# Patient Record
Sex: Female | Born: 1965 | Race: White | Hispanic: No | Marital: Married | Smoking: Former smoker
Health system: Southern US, Community
[De-identification: ages and names within clinical notes are randomized; demographics above are authoritative.]

## PROBLEM LIST (undated history)

## (undated) DIAGNOSIS — M797 Fibromyalgia: Secondary | ICD-10-CM

## (undated) DIAGNOSIS — F319 Bipolar disorder, unspecified: Secondary | ICD-10-CM

## (undated) DIAGNOSIS — M1990 Unspecified osteoarthritis, unspecified site: Secondary | ICD-10-CM

## (undated) DIAGNOSIS — F419 Anxiety disorder, unspecified: Secondary | ICD-10-CM

## (undated) HISTORY — PX: ABDOMINAL HYSTERECTOMY: SHX81

---

## 2011-09-26 ENCOUNTER — Encounter: Payer: Self-pay | Admitting: *Deleted

## 2011-09-26 ENCOUNTER — Emergency Department (HOSPITAL_COMMUNITY)
Admission: EM | Admit: 2011-09-26 | Discharge: 2011-09-27 | Disposition: A | Discharge status: Other Acute Inpatient Hospital | Payer: Medicare Other | Source: Home / Self Care | Attending: Emergency Medicine | Admitting: Emergency Medicine

## 2011-09-26 DIAGNOSIS — F1410 Cocaine abuse, uncomplicated: Secondary | ICD-10-CM | POA: Insufficient documentation

## 2011-09-26 DIAGNOSIS — F319 Bipolar disorder, unspecified: Secondary | ICD-10-CM

## 2011-09-26 DIAGNOSIS — F3130 Bipolar disorder, current episode depressed, mild or moderate severity, unspecified: Secondary | ICD-10-CM | POA: Insufficient documentation

## 2011-09-26 DIAGNOSIS — IMO0001 Reserved for inherently not codable concepts without codable children: Secondary | ICD-10-CM | POA: Insufficient documentation

## 2011-09-26 DIAGNOSIS — F1210 Cannabis abuse, uncomplicated: Secondary | ICD-10-CM

## 2011-09-26 DIAGNOSIS — Z79899 Other long term (current) drug therapy: Secondary | ICD-10-CM | POA: Insufficient documentation

## 2011-09-26 DIAGNOSIS — R45851 Suicidal ideations: Secondary | ICD-10-CM | POA: Insufficient documentation

## 2011-09-26 DIAGNOSIS — R450 Nervousness: Secondary | ICD-10-CM | POA: Insufficient documentation

## 2011-09-26 DIAGNOSIS — F411 Generalized anxiety disorder: Secondary | ICD-10-CM | POA: Insufficient documentation

## 2011-09-26 DIAGNOSIS — R443 Hallucinations, unspecified: Secondary | ICD-10-CM | POA: Insufficient documentation

## 2011-09-26 DIAGNOSIS — IMO0002 Reserved for concepts with insufficient information to code with codable children: Secondary | ICD-10-CM | POA: Insufficient documentation

## 2011-09-26 DIAGNOSIS — M542 Cervicalgia: Secondary | ICD-10-CM | POA: Insufficient documentation

## 2011-09-26 DIAGNOSIS — R259 Unspecified abnormal involuntary movements: Secondary | ICD-10-CM | POA: Insufficient documentation

## 2011-09-26 HISTORY — DX: Bipolar disorder, unspecified: F31.9

## 2011-09-26 LAB — SALICYLATE LEVEL: Salicylate Lvl: <2 mg/dL — ABNORMAL LOW (ref 2.8–20.0)

## 2011-09-26 LAB — COMPREHENSIVE METABOLIC PANEL
BUN: 16 mg/dL (ref 6–23)
CO2: 20 mEq/L (ref 19–32)
Calcium: 8.4 mg/dL (ref 8.4–10.5)
Chloride: 108 mEq/L (ref 96–112)
Creatinine, Ser: 0.7 mg/dL (ref 0.50–1.10)
GFR calc non Af Amer: >90 mL/min (ref >90)
Total Bilirubin: 0.2 mg/dL — ABNORMAL LOW (ref 0.3–1.2)

## 2011-09-26 LAB — CBC
HCT: 33.6 % — ABNORMAL LOW (ref 36.0–46.0)
MCH: 30.2 pg (ref 26.0–34.0)
MCV: 88.2 fL (ref 78.0–100.0)
Platelets: 192 10*3/uL (ref 150–400)
RBC: 3.81 MIL/uL — ABNORMAL LOW (ref 3.87–5.11)
RDW: 13.5 % (ref 11.5–15.5)
WBC: 7.9 10*3/uL (ref 4.0–10.5)

## 2011-09-26 LAB — ETHANOL: Alcohol, Ethyl (B): <11 mg/dL (ref 0–11)

## 2011-09-26 MED ORDER — ZOLPIDEM TARTRATE 5 MG PO TABS
5.0000 mg | ORAL_TABLET | Freq: Every evening | ORAL | Status: DC | PRN
  Administered 2011-09-27: 5 mg via ORAL
  Filled 2011-09-26: qty 1

## 2011-09-26 MED ORDER — TRAMADOL HCL 50 MG PO TABS
100.0000 mg | ORAL_TABLET | Freq: Two times a day (BID) | ORAL | Status: DC | PRN
  Administered 2011-09-26: 100 mg via ORAL
  Filled 2011-09-26: qty 2

## 2011-09-26 MED ORDER — BUPROPION HCL ER (SR) 100 MG PO TB12
200.0000 mg | ORAL_TABLET | Freq: Two times a day (BID) | ORAL | Status: DC
  Administered 2011-09-27: 200 mg via ORAL
  Filled 2011-09-26 (×2): qty 2

## 2011-09-26 MED ORDER — CYCLOBENZAPRINE HCL 10 MG PO TABS
10.0000 mg | ORAL_TABLET | Freq: Once | ORAL | Status: AC
  Administered 2011-09-26: 10 mg via ORAL
  Filled 2011-09-26: qty 1

## 2011-09-26 MED ORDER — ALUM & MAG HYDROXIDE-SIMETH 200-200-20 MG/5ML PO SUSP: 30.0000 mL | ORAL | Status: DC | PRN

## 2011-09-26 MED ORDER — IBUPROFEN 200 MG PO TABS: 600.0000 mg | ORAL_TABLET | Freq: Three times a day (TID) | ORAL | Status: DC | PRN

## 2011-09-26 MED ORDER — POTASSIUM CHLORIDE 20 MEQ/15ML (10%) PO LIQD
20.0000 meq | Freq: Once | ORAL | Status: AC
  Administered 2011-09-27: 20 meq via ORAL
  Filled 2011-09-26: qty 15

## 2011-09-26 MED ORDER — LORAZEPAM 1 MG PO TABS
1.0000 mg | ORAL_TABLET | Freq: Three times a day (TID) | ORAL | Status: DC | PRN
  Administered 2011-09-26 – 2011-09-27 (×2): 1 mg via ORAL
  Filled 2011-09-26 (×2): qty 1

## 2011-09-26 MED ORDER — ONDANSETRON HCL 8 MG PO TABS: 4.0000 mg | ORAL_TABLET | Freq: Three times a day (TID) | ORAL | Status: DC | PRN

## 2011-09-26 MED ORDER — LORAZEPAM 2 MG/ML IJ SOLN
INTRAMUSCULAR | Status: AC
  Filled 2011-09-26: qty 1

## 2011-09-26 MED ORDER — AMOXICILLIN 500 MG PO CAPS
1000.0000 mg | ORAL_CAPSULE | Freq: Two times a day (BID) | ORAL | Status: DC
  Administered 2011-09-26: 1000 mg via ORAL
  Filled 2011-09-26: qty 2

## 2011-09-26 MED ORDER — ACETAMINOPHEN 325 MG PO TABS
650.0000 mg | ORAL_TABLET | ORAL | Status: DC | PRN
  Administered 2011-09-27 (×2): 650 mg via ORAL
  Filled 2011-09-26 (×2): qty 2

## 2011-09-26 NOTE — ED Notes (Signed)
To ed for eval of 'manic' episode. Pt and pt's husband state that pt held gun to head yesterday in front of son and friends. Pt denies wanting to kill herself.

## 2011-09-26 NOTE — ED Notes (Signed)
Family contact is greg Cinque [phone] cell Home [phone]

## 2011-09-26 NOTE — ED Provider Notes (Signed)
History     CSN: [id number] Arrival date & time: 09/26/2011  3:55 PM   First MD Initiated Contact with Patient 09/26/11 1625      Chief Complaint  Patient presents with  . Suicidal    (Consider location/radiation/quality/duration/timing/severity/associated sxs/prior treatment) HPI Pt reports feeling depressed "for a while', noting that she has had marital problems recently and has a son in [place], in addition to multiple other social stressors. Saturday evening, after consuming a significant amount of  EtOH, pt began arguing with husband. Pt "broke down" and put a loaded gun to her head. Husband reports pt was found by her son with the gun to her head. Pt disagrees, saying she did not intend to harm herself and was trying to make a point; also reports she did not know the gun was loaded. Pt denies any SI or suicidal plan. Pt denies HI. Pt denies hallucinations, but husband reports she was speaking to people that were not present at the party. Pt admits to prior suicide attempt, in 2005. Pt has had multiple psychiatric admissions for bipolar, most recently in 2005. Pt states that she would like to be admitted for psychiatric treatment today. Pt reports pain to entire body from ultram wearing off and feels her fibromyalgia is "flaring up".    Past Medical History  Diagnosis Date  . Bipolar 1 disorder     Past Surgical History  Procedure Date  . Abdominal hysterectomy     History reviewed. No pertinent family history.  History  Substance Use Topics  . Smoking status: Not on file  . Smokeless tobacco: Not on file  . Alcohol Use: No     Review of Systems  Constitutional: Negative for fever and chills.  HENT: Positive for neck pain. Negative for hearing loss, ear pain, nosebleeds, neck stiffness, voice change, sinus pressure and tinnitus.   Eyes: Negative for pain and visual disturbance.  Respiratory: Negative for cough, chest tightness and shortness of breath.     Cardiovascular: Negative for chest pain and palpitations.  Gastrointestinal: Negative for nausea, vomiting, abdominal pain and diarrhea.  Genitourinary: Negative for dysuria and hematuria.  Musculoskeletal: Negative for back pain, joint swelling and gait problem.  Skin: Negative for rash and wound.  Neurological: Negative for dizziness, seizures, syncope, weakness, numbness and headaches.  Psychiatric/Behavioral: Positive for hallucinations, behavioral problems, decreased concentration and agitation. Negative for confusion and sleep disturbance. The patient is nervous/anxious.     Allergies  Review of patient's allergies indicates no known allergies.  Home Medications   Current Outpatient Rx  Name Route Sig Dispense Refill  . AMOXICILLIN 500 MG PO CAPS Oral Take 1,000 mg by mouth 2 (two) times daily. On course for sinus infection     . AMPHETAMINE-DEXTROAMPHETAMINE 10 MG PO TABS Oral Take 10 mg by mouth 2 (two) times daily as needed. ADD     . BUPROPION HCL ER (SR) 200 MG PO TB12 Oral Take 200 mg by mouth 2 (two) times daily.      Marland Kitchen CLONAZEPAM 1 MG PO TABS Oral Take 1-2 mg by mouth 3 (three) times daily. Takes 1 tab twice a day and 2 tabs at bedtime.     . CYCLOBENZAPRINE HCL 10 MG PO TABS Oral Take 20 mg by mouth at bedtime. Muscle spasm.     Marland Kitchen ESOMEPRAZOLE MAGNESIUM 40 MG PO CPDR Oral Take 40 mg by mouth daily before breakfast.      . MELOXICAM 15 MG PO TABS Oral Take 15 mg  by mouth daily.      Marland Kitchen OMEPRAZOLE 20 MG PO CPDR Oral Take 20 mg by mouth daily.      . TOPIRAMATE 200 MG PO TABS Oral Take 6,000 mg by mouth 2 (two) times daily.      . TRAMADOL HCL 50 MG PO TABS Oral Take 50 mg by mouth every 6 (six) hours as needed. Maximum dose= 8 tablets per day. pain       BP 119/75  Pulse 91  Temp(Src) 98 F (36.7 C) (Oral)  Resp 16  SpO2 98%  Physical Exam  Nursing note and vitals reviewed. Constitutional: She is oriented to person, place, and time. She appears well-developed and  well-nourished.       Anxious appearing  HENT:  Head: Normocephalic and atraumatic.  Eyes: EOM are normal. Pupils are equal, round, and reactive to light.  Neck: Normal range of motion. Neck supple.  Cardiovascular: Normal rate, regular rhythm, normal heart sounds and intact distal pulses.   Pulmonary/Chest: Effort normal. No respiratory distress. She has no wheezes.  Abdominal: Soft. Bowel sounds are normal. She exhibits no distension. There is no tenderness.  Musculoskeletal: Normal range of motion. She exhibits no edema and no tenderness.  Lymphadenopathy:    She has no cervical adenopathy.  Neurological: She is alert and oriented to person, place, and time. No cranial nerve deficit. Coordination normal.       Slightly tremulous  Skin: Skin is warm and dry. No rash noted.    ED Course  Procedures (including critical care time)  Labs Reviewed  CBC - Abnormal; Notable for the following:    RBC 3.81 (*)    Hemoglobin 11.5 (*)    HCT 33.6 (*)    All other components within normal limits  COMPREHENSIVE METABOLIC PANEL - Abnormal; Notable for the following:    Potassium 3.3 (*)    Glucose, Bld 126 (*)    Total Bilirubin 0.2 (*)    All other components within normal limits  URINE RAPID DRUG SCREEN (HOSP PERFORMED) - Abnormal; Notable for the following:    Cocaine POSITIVE (*)    Benzodiazepines POSITIVE (*)    Tetrahydrocannabinol POSITIVE (*)    All other components within normal limits  SALICYLATE LEVEL - Abnormal; Notable for the following:    Salicylate Lvl <2.0 (*)    All other components within normal limits  ETHANOL  ACETAMINOPHEN LEVEL   No results found.   1. Bipolar disorder   2. Cocaine abuse   3. Marijuana abuse       MDM  Patient presents for medical clearance. She denies SI, HI, or hallucinations; or is provided a different story and I do have concerns that she is a danger to herself. She is here voluntarily though if she changes her mind and would like  to leave, we may need to place her on involuntary status. I will call the ACT team   5:40 PM I have spoken with the counselor with the ACT team who will come to see the patient.       [address] [place], [place] 09/27/11 [phone]

## 2011-09-27 ENCOUNTER — Encounter (HOSPITAL_COMMUNITY): Payer: Self-pay | Admitting: *Deleted

## 2011-09-27 ENCOUNTER — Inpatient Hospital Stay (HOSPITAL_COMMUNITY)
Admission: AD | Admit: 2011-09-27 | Discharge: 2011-09-30 | DRG: 885 | Disposition: A | Discharge status: Home / Self Care | Payer: Medicare Other | Source: Ambulatory Visit | Attending: Psychiatry | Admitting: Psychiatry

## 2011-09-27 DIAGNOSIS — F1210 Cannabis abuse, uncomplicated: Secondary | ICD-10-CM

## 2011-09-27 DIAGNOSIS — Z79899 Other long term (current) drug therapy: Secondary | ICD-10-CM

## 2011-09-27 DIAGNOSIS — F3160 Bipolar disorder, current episode mixed, unspecified: Principal | ICD-10-CM

## 2011-09-27 DIAGNOSIS — F609 Personality disorder, unspecified: Secondary | ICD-10-CM

## 2011-09-27 DIAGNOSIS — F411 Generalized anxiety disorder: Secondary | ICD-10-CM | POA: Diagnosis present

## 2011-09-27 DIAGNOSIS — F3162 Bipolar disorder, current episode mixed, moderate: Secondary | ICD-10-CM | POA: Diagnosis present

## 2011-09-27 DIAGNOSIS — M129 Arthropathy, unspecified: Secondary | ICD-10-CM

## 2011-09-27 DIAGNOSIS — F1910 Other psychoactive substance abuse, uncomplicated: Secondary | ICD-10-CM | POA: Diagnosis present

## 2011-09-27 DIAGNOSIS — IMO0001 Reserved for inherently not codable concepts without codable children: Secondary | ICD-10-CM

## 2011-09-27 DIAGNOSIS — F1010 Alcohol abuse, uncomplicated: Secondary | ICD-10-CM

## 2011-09-27 DIAGNOSIS — F312 Bipolar disorder, current episode manic severe with psychotic features: Secondary | ICD-10-CM

## 2011-09-27 DIAGNOSIS — F1410 Cocaine abuse, uncomplicated: Secondary | ICD-10-CM

## 2011-09-27 HISTORY — DX: Anxiety disorder, unspecified: F41.9

## 2011-09-27 HISTORY — DX: Fibromyalgia: M79.7

## 2011-09-27 HISTORY — DX: Unspecified osteoarthritis, unspecified site: M19.90

## 2011-09-27 LAB — RAPID URINE DRUG SCREEN, HOSP PERFORMED
Cocaine: POSITIVE — AB
Opiates: NOT DETECTED

## 2011-09-27 MED ORDER — ARIPIPRAZOLE 2 MG PO TABS
2.0000 mg | ORAL_TABLET | Freq: Every day | ORAL | Status: DC
  Administered 2011-09-27 – 2011-09-28 (×2): 2 mg via ORAL
  Filled 2011-09-27 (×3): qty 1

## 2011-09-27 MED ORDER — CLONAZEPAM 1 MG PO TABS
2.0000 mg | ORAL_TABLET | Freq: Every day | ORAL | Status: DC
  Administered 2011-09-27 – 2011-09-29 (×3): 2 mg via ORAL
  Filled 2011-09-27 (×3): qty 2

## 2011-09-27 MED ORDER — AMOXICILLIN 500 MG PO CAPS
1000.0000 mg | ORAL_CAPSULE | Freq: Two times a day (BID) | ORAL | Status: DC
  Administered 2011-09-27 – 2011-09-30 (×6): 1000 mg via ORAL
  Filled 2011-09-27: qty 20
  Filled 2011-09-27 (×4): qty 2
  Filled 2011-09-27: qty 20
  Filled 2011-09-27 (×6): qty 2

## 2011-09-27 MED ORDER — CYCLOBENZAPRINE HCL 10 MG PO TABS
20.0000 mg | ORAL_TABLET | Freq: Every day | ORAL | Status: DC
  Administered 2011-09-27 – 2011-09-29 (×3): 20 mg via ORAL
  Filled 2011-09-27 (×3): qty 2
  Filled 2011-09-27: qty 10
  Filled 2011-09-27 (×2): qty 2

## 2011-09-27 MED ORDER — BUPROPION HCL ER (SR) 100 MG PO TB12
200.0000 mg | ORAL_TABLET | Freq: Two times a day (BID) | ORAL | Status: DC
  Administered 2011-09-27 – 2011-09-30 (×6): 200 mg via ORAL
  Filled 2011-09-27 (×6): qty 2
  Filled 2011-09-27: qty 28
  Filled 2011-09-27 (×3): qty 2
  Filled 2011-09-27: qty 28
  Filled 2011-09-27: qty 2

## 2011-09-27 MED ORDER — MELOXICAM 15 MG PO TABS
15.0000 mg | ORAL_TABLET | Freq: Every day | ORAL | Status: DC
  Administered 2011-09-27 – 2011-09-28 (×2): 15 mg via ORAL
  Filled 2011-09-27 (×4): qty 1

## 2011-09-27 MED ORDER — POTASSIUM CHLORIDE CRYS ER 20 MEQ PO TBCR
20.0000 meq | EXTENDED_RELEASE_TABLET | Freq: Two times a day (BID) | ORAL | Status: AC
Start: 2011-09-27 — End: 2011-09-29
  Administered 2011-09-27 – 2011-09-29 (×4): 20 meq via ORAL
  Filled 2011-09-27 (×4): qty 1

## 2011-09-27 MED ORDER — CLONAZEPAM 0.5 MG PO TABS
0.5000 mg | ORAL_TABLET | ORAL | Status: DC
  Administered 2011-09-27 – 2011-09-28 (×3): 0.5 mg via ORAL
  Filled 2011-09-27 (×3): qty 1

## 2011-09-27 MED ORDER — TRAMADOL HCL 50 MG PO TABS
50.0000 mg | ORAL_TABLET | Freq: Four times a day (QID) | ORAL | Status: DC | PRN
  Administered 2011-09-27 – 2011-09-28 (×3): 50 mg via ORAL
  Filled 2011-09-27 (×3): qty 1

## 2011-09-27 MED ORDER — TOPIRAMATE 100 MG PO TABS
600.0000 mg | ORAL_TABLET | Freq: Every day | ORAL | Status: DC
  Administered 2011-09-27 – 2011-09-29 (×3): 600 mg via ORAL
  Filled 2011-09-27 (×5): qty 6
  Filled 2011-09-27: qty 30

## 2011-09-27 MED ORDER — PANTOPRAZOLE SODIUM 40 MG PO TBEC
40.0000 mg | DELAYED_RELEASE_TABLET | Freq: Every day | ORAL | Status: DC
  Administered 2011-09-27 – 2011-09-29 (×3): 40 mg via ORAL
  Filled 2011-09-27 (×3): qty 1
  Filled 2011-09-27: qty 5
  Filled 2011-09-27 (×2): qty 1

## 2011-09-27 NOTE — Progress Notes (Addendum)
Patient admitted to the unit this morning.  Was cooperative with the admission process.  Full admission not able to be completed until dinner time.  Her husband was there at that time and they got into a fight over her urine being positive for cocaine.  She continues to deny that she used any, but does admit to [facility].  She also admits to ETOH use, average of 4-6 drinks at least 3 days per week.  While her husband was here she wrote a note on a napkin that she handed to me indicating husband was using LSD.  She was oriented to the unit and offered food and fluids.  Addendum:  Patients husband left the unit a little while ago and she came to me stating she is the one who did the LSD on Sunday.  She still denies using cocaine, but she was encouraged to call her son and speak with him about what he recalls of the events while she was at his home.  She did continue to elude to far more substance abuse than she admitted to on the initial interview.  She will likely have to be watched for symptoms of DT's.

## 2011-09-27 NOTE — Progress Notes (Signed)
Per State Regulation 482.30  This chart was reviewed for medical necessity with respect to the patient's Admission/Duration of stay.   Next review due:  09/30/11   Ambrose Mantle, LCSW  09/27/2011  3:04 PM

## 2011-09-27 NOTE — BH Assessment (Signed)
Assessment Note   Rita Bennett is an 45 y.o. female.  Rita Bennett was brought to [facility] by her spouse, Tammy Sours.  On Saturday she and husband had been arguing off and on.  This has happened steadily over the last few weeks.  Rita Bennett said that she drank some tequila shots and beers that day.  Husband said that she came out of the bedroom and was arguing with unseen persons and rolling on the floor.  There were other people at the home visiting.  Rita Bennett admits that later she went to the bedroom and got the gun from under the bed and held it to her head and told others in the home "You wouldn't think it was funny if I killed myself."  Rita Bennett said that she remembers doing that but not the arguing with unseen persons.  This mania continued throughout Sunday also.  By husband's report she was hallucinating and throwing things at him and yelling.  She said that she does not remember doing these things.  At this time she denies any SI but has history of attempts and inpatient hospitalizations.  Rita Bennett denies HI at this time.  Rita Bennett denies current A/V hallucinations but husband said that she was behaving as if responding to internal stimuli most of the weekend.  Rita Bennett admits that she has been off of some of her medications.  Her psychiatrist is not seen until two more months.  Rita Bennett needs to be stabilized on her medications at a psychiatric hospital at this time.  She has been at [facility] before, with the most recent visit being in 2005. Axis I: Bipolar, Manic Axis II: Deferred Axis III:  Past Medical History  Diagnosis Date  . Bipolar 1 disorder    Axis IV: occupational problems and other psychosocial or environmental problems Axis V: 31-40 impairment in reality testing  Past Medical History:  Past Medical History  Diagnosis Date  . Bipolar 1 disorder     Past Surgical History  Procedure Date  . Abdominal hysterectomy     Family History: History reviewed. No pertinent family history.  Social History:  does  not have a smoking history on file. She does not have any smokeless tobacco history on file. She reports that she does not drink alcohol or use illicit drugs.  Allergies: No Known Allergies  Home Medications:  Medications Prior to Admission  Medication Dose Route Frequency Provider Last Rate Last Dose  . acetaminophen (TYLENOL) tablet 650 mg  650 mg Oral Q4H PRN Shaaron Adler, PA   650 mg at 09/27/11 0110  . alum & mag hydroxide-simeth (MAALOX/MYLANTA) 200-200-20 MG/5ML suspension 30 mL  30 mL Oral PRN Shaaron Adler, PA      . amoxicillin (AMOXIL) capsule 1,000 mg  1,000 mg Oral Q12H Shaaron Adler, [place]   1,000 mg at 09/26/11 2317  . buPROPion [facility] SR) 12 hr tablet 200 mg  200 mg Oral BID Shaaron Adler, PA   200 mg at 09/27/11 0114  . cyclobenzaprine (FLEXERIL) tablet 10 mg  10 mg Oral Once Shaaron Adler, [place]   10 mg at 09/26/11 2317  . ibuprofen (ADVIL,MOTRIN) tablet 600 mg  600 mg Oral Q8H PRN Shaaron Adler, [place]      . LORazepam (ATIVAN) tablet 1 mg  1 mg Oral Q8H PRN Shaaron Adler, PA   1 mg at 09/26/11 2008  . ondansetron (ZOFRAN) tablet 4 mg  4 mg Oral Q8H PRN Shaaron Adler, [place]      .  potassium chloride 20 MEQ/15ML (10%) liquid 20 mEq  20 mEq Oral Once Shaaron Adler, PA   20 mEq at 09/27/11 0110  . traMADol (ULTRAM) tablet 100 mg  100 mg Oral Q12H PRN Shaaron Adler, PA   100 mg at 09/26/11 2008  . zolpidem (AMBIEN) tablet 5 mg  5 mg Oral QHS PRN Shaaron Adler, PA   5 mg at 09/27/11 0110  . DISCONTD: LORazepam (ATIVAN) 2 MG/ML injection            No current outpatient prescriptions on file as of 09/26/2011.    OB/GYN Status:  No LMP recorded.  General Assessment Data Assessment Number: 1  Living Arrangements: Spouse/significant other Can pt return to current living arrangement?: Yes Admission Status: Voluntary Is patient capable of signing voluntary admission?:  Yes Transfer from: Acute Hospital Referral Source: Self/Family/Friend  Risk to self Suicidal Ideation: No Suicidal Intent:  (Did hold a gun to her head on Saturday 12/01) Is patient at risk for suicide?: Yes Suicidal Plan?:  (Did hold gun to head.  Access to weapons.) Access to Means:  (Guns have been put away.) What has been your use of drugs/alcohol within the last 12 months?:  (Has continued to abuse ETOH.) Other Self Harm Risks:  (Pointing a gun at her own head.) Triggers for Past Attempts: None known Intentional Self Injurious Behavior: None Factors that decrease suicide risk: Sense of responsibility to family Family Suicide History: Unknown Recent stressful life event(s): Conflict (Comment) (Increased arguements with spouse.) Persecutory voices/beliefs?: No Depression: Yes Depression Symptoms: Insomnia;Isolating;Loss of interest in usual pleasures;Feeling angry/irritable Substance abuse history and/or treatment for substance abuse?: Yes Suicide prevention information given to non-admitted patients: Not applicable  Risk to Others Homicidal Ideation: No Thoughts of Harm to Others: No Current Homicidal Intent: No Current Homicidal Plan: No Access to Homicidal Means: No Identified Victim:  (No one) History of harm to others?: No Assessment of Violence: None Noted Violent Behavior Description:  (Patient manic but cooperative.) Does patient have access to weapons?: Yes (Comment) Criminal Charges Pending?: No Does patient have a court date: No  Mental Status Report Appear/Hygiene:  (Casual) Eye Contact: Good Motor Activity: Agitation;Restlessness Speech: Rapid;Tangential Level of Consciousness: Alert Mood: Depressed;Anxious;Sad Affect: Anxious;Depressed Anxiety Level: Moderate Thought Processes: Coherent;Irrelevant;Tangential Judgement: Impaired Orientation: Person;Place;Time;Situation Obsessive Compulsive Thoughts/Behaviors: Minimal  Cognitive  Functioning Concentration: Decreased Memory: Remote Intact;Recent Impaired IQ: Average Insight: Fair Impulse Control: Poor Appetite: Good Weight Loss:  (N/A) Weight Gain:  (N/A) Sleep: No Change Total Hours of Sleep:  (<6H/D Disturbed sleep) Vegetative Symptoms: Staying in bed  Prior Inpatient/Outpatient Therapy Prior Therapy: Outpatient Prior Therapy Dates:  (Since 2004) Prior Therapy Facilty/Provider(s):  (Dr. Maudry Mayhew) Reason for Treatment:  (Bi-polar d/o)  ADL Screening (condition at time of admission) Patient's cognitive ability adequate to safely complete daily activities?: Yes Patient able to express need for assistance with ADLs?: Yes Independently performs ADLs?: Yes Weakness of Legs: None Weakness of Arms/Hands: None  Home Assistive Devices/Equipment Home Assistive Devices/Equipment: None      Values / Beliefs Cultural Requests During Hospitalization: None Spiritual Requests During Hospitalization: None        Additional Information 1:1 In Past 12 Months?: No CIRT Risk: No Elopement Risk: No Does patient have medical clearance?: Yes     Disposition:  Disposition Disposition of Patient: Inpatient treatment program Type of inpatient treatment program: Adult (Please run)  On Site Evaluation by:   Reviewed with Physician:  Dr. Karlene Einstein, Berna Spare  Ray 09/27/2011 1:53 AM

## 2011-09-27 NOTE — Progress Notes (Signed)
Suicide Risk Assessment  Admission Assessment     Demographic factors:    Current Mental Status:    Loss Factors:    Historical Factors:    Risk Reduction Factors:     CLINICAL FACTORS:   Severe Anxiety and/or Agitation Bipolar Disorder:   Mixed State Alcohol/Substance Abuse/Dependencies Chronic Pain More than one psychiatric diagnosis Previous Psychiatric Diagnoses and Treatments Medical Diagnoses and Treatments/Surgeries  Diagnosis:  Axis I: Bipolar I Disorder - Mixed Type. Polysubstance Abuse - Alcohol, Cannabis and Cocaine.  The patient was seen today and reports the following:   AL's: Intact  Sleep: The patient reports to having difficulty maintaining sleep with frequent awakenings. Appetite: The patient reports a fair appetite.   Mild>(1-10) >Severe  Hopelessness (1-10): 0  Depression (1-10): 6-7  Anxiety (1-10): 7-8   Suicidal Ideation: The patient adamantly denies any suicidal ideations today.  Plan: No  Intent: No  Means: No   Homicidal Ideation: The patient adamantly denies any homicidal ideations today.  Plan: No  Intent: No.  Means: No   General Appearance /Behavior: Neat and Casual Eye Contact: Good. Speech: Normal. Motor Behavior: Mildly Agitated. Level of Consciousness: Alert. Mental Status: O x 3 Mood: Moderately Depressed. Affect: Moderately Agitated. Anxiety Level: Moderate to severely anxious. Thought Process: Some mild disorganization. Thought Content: The patient denies any auditory or visual hallucinations or delusional thinking. Perception: Normal. Judgment: Fair. Insight: Fair. Cognition: Orientation time, place and person.   Treatment Plan Summary: 1. Daily contact with patient to assess and evaluate symptoms and progress in treatment  2. Medication management  3. The patient will deny suicidal ideations or homicidal ideations for 48 hours prior to discharge and have a depression and anxiety rating of 3 or less. The patient will  also deny any auditory or visual hallucinations or delusional thinking and display no manic or hypomanic behaviors.   Plan:  1. Restarted medications as prescribed by the patient's outpatient providers.  2. Will add Abilify 2 mgs po qhs x 1 day with plans to increase to 5 mgs po qhs tomorrow if tolerated for mood stabilization. 3. Will D/C/ Adderall. 4. Continue to monitor.   SUICIDE RISK:   Minimal: No identifiable suicidal ideation.  Patients presenting with no risk factors but with morbid ruminations; may be classified as minimal risk based on the severity of the depressive symptoms   Randy Readling 09/27/2011, 12:20 PM

## 2011-09-27 NOTE — H&P (Signed)
Psychiatric Admission Assessment Adult  Patient Identification:  Rita Bennett Date of Evaluation:  09/27/2011  History of Present Illness:: THis is a 45yo MWM who presented to the ED impaired from Cocaine THC Benzoes and alcohol. She had pointed a loaded gun to her head after arguing with her husband-to make a point. Was not aware that the  gun was loaded and denies she was suicidal.Reports she had more depressed recently and drank to be sociable with visitors in her home. She said she had too much too drink and blacked out. Denies DT's or withdrawal seizures.  Past Psychiatric History:one prior admission here 2005 similar situation. Has been treated by Dr. Evelene Croon since 2004.  Substance Abuse History:She drinks but does not consider it to be a problem. Husband says her drinking can be a problem and she is minimizing her substance abuse issues.  Social History:    reports that she quit smoking about 21 months ago. She does not have any smokeless tobacco history on file. She reports that she drinks alcohol. She reports that she uses illicit drugs (Cocaine and Marijuana). This is her 3rd marriage. This relationship is 46 years old and they have been married over 2 years. Has a GED and 2 sons ages 35 & 56. She receives disability.  Family Psych History:Says her father was Bipolar .  Past Medical History:     Past Medical History  Diagnosis Date  . Bipolar 1 disorder   . Fibromyalgia   . Arthritis   . Anxiety        Past Surgical History  Procedure Date  . Abdominal hysterectomy     Allergies: No Known Allergies  Current Medications:  Prior to Admission medications   Medication Sig Start Date End Date Taking? Authorizing Provider  amoxicillin (AMOXIL) 500 MG capsule Take 1,000 mg by mouth 2 (two) times daily. On course for sinus infection   Yes Historical Provider, MD  meloxicam (MOBIC) 15 MG tablet Take 15 mg by mouth daily.    Yes Historical Provider, MD  topiramate (TOPAMAX) 200 MG  tablet Take 600 mg by mouth 2 (two) times daily.    Yes Historical Provider, MD  traMADol (ULTRAM) 50 MG tablet Take 50 mg by mouth every 6 (six) hours as needed. Maximum dose= 8 tablets per day. pain   Yes Historical Provider, MD  buPROPion (WELLBUTRIN SR) 200 MG 12 hr tablet Take 200 mg by mouth 2 (two) times daily.     Historical Provider, MD  cyclobenzaprine (FLEXERIL) 10 MG tablet Take 20 mg by mouth at bedtime. Muscle spasm.    Historical Provider, MD    Mental Status Examination/Evaluation: Objective:  Appearance: Disheveled  Psychomotor Activity:  Decreased  Eye Contact::  Good  Speech:  Normal Rate  Volume:  Normal  Mood:  Feels agitated has mild hand tremor   Affect:  Congruent  Thought Process:    Orientation:  Full  Thought Content:  Clear rational goal oriented  Suicidal Thoughts:  No  Homicidal Thoughts:  No  Judgement:  Impaired  Insight:  Shallow    DIAGNOSIS:    AXIS I Substance Induced Mood Disorder  AXIS II Personality Disorder NOS  AXIS III See medical history.  AXIS IV Substance abuse and chronic pain  AXIS V 51-60 moderate symptoms     Treatment Plan Summary: Admit for medically supported substance withdrawal. Psych meds will be adjusted as indicated. Has outside prescriber and therapist.   Agree with H&P from ED she had no  additional symptoms or concerns tonight.

## 2011-09-27 NOTE — ED Provider Notes (Signed)
Medical screening examination/treatment/procedure(s) were performed by non-physician practitioner and as supervising physician I was immediately available for consultation/collaboration.   Leigh-Ann Jemell Town, MD 09/27/11 1521 

## 2011-09-27 NOTE — BH Assessment (Signed)
Assessment Note   Rita Bennett is an 45 y.o. female.  Update:  Writer was briefed on pt status.  Writer informed that pt was accepted to [facility] by NP Landry Corporal to Dr. Allena Katz to bed 402-2.  Previous clinician notified EDP Otter.  Writer completed support paperwork and assessment notification and faxed to [facility] to log.  Updated ED staff.  Pt is to be transported to [facility] via security, as pt is voluntary and ED staff to arrange transport.  Axis I: Bipolar, Manic Axis II: Deferred Axis III:  Past Medical History  Diagnosis Date  . Bipolar 1 disorder    Axis IV: occupational problems and other psychosocial or environmental problems Axis V: 31-40 impairment in reality testing  Past Medical History:  Past Medical History  Diagnosis Date  . Bipolar 1 disorder     Past Surgical History  Procedure Date  . Abdominal hysterectomy     Family History: History reviewed. No pertinent family history.  Social History:  does not have a smoking history on file. She does not have any smokeless tobacco history on file. She reports that she does not drink alcohol or use illicit drugs.  Allergies: No Known Allergies  Home Medications:  Medications Prior to Admission  Medication Dose Route Frequency Provider Last Rate Last Dose  . acetaminophen (TYLENOL) tablet 650 mg  650 mg Oral Q4H PRN Shaaron Adler, PA   650 mg at 09/27/11 1610  . alum & mag hydroxide-simeth (MAALOX/MYLANTA) 200-200-20 MG/5ML suspension 30 mL  30 mL Oral PRN Shaaron Adler, PA      . amoxicillin (AMOXIL) capsule 1,000 mg  1,000 mg Oral Q12H Shaaron Adler, [place]   1,000 mg at 09/26/11 2317  . buPROPion [facility] SR) 12 hr tablet 200 mg  200 mg Oral BID Shaaron Adler, PA   200 mg at 09/27/11 0114  . cyclobenzaprine (FLEXERIL) tablet 10 mg  10 mg Oral Once Shaaron Adler, [place]   10 mg at 09/26/11 2317  . ibuprofen (ADVIL,MOTRIN) tablet 600 mg  600 mg Oral Q8H PRN Shaaron Adler,  [place]      . LORazepam (ATIVAN) tablet 1 mg  1 mg Oral Q8H PRN Shaaron Adler, PA   1 mg at 09/27/11 9604  . ondansetron (ZOFRAN) tablet 4 mg  4 mg Oral Q8H PRN Shaaron Adler, PA      . potassium chloride 20 MEQ/15ML (10%) liquid 20 mEq  20 mEq Oral Once Shaaron Adler, PA   20 mEq at 09/27/11 0110  . traMADol (ULTRAM) tablet 100 mg  100 mg Oral Q12H PRN Shaaron Adler, PA   100 mg at 09/26/11 2008  . zolpidem (AMBIEN) tablet 5 mg  5 mg Oral QHS PRN Shaaron Adler, PA   5 mg at 09/27/11 0110  . DISCONTD: LORazepam (ATIVAN) 2 MG/ML injection            No current outpatient prescriptions on file as of 09/26/2011.    OB/GYN Status:  No LMP recorded.  General Assessment Data Assessment Number: 1  Living Arrangements: Spouse/significant other Can pt return to current living arrangement?: Yes Admission Status: Voluntary Is patient capable of signing voluntary admission?: Yes Transfer from: Acute Hospital Referral Source: Self/Family/Friend  Risk to self Suicidal Ideation: No Suicidal Intent:  (Did hold a gun to her head on Saturday 12/01) Is patient at risk for suicide?: Yes Suicidal Plan?:  (Did hold gun to head.  Access to weapons.)  Access to Means:  (Guns have been put away.) What has been your use of drugs/alcohol within the last 12 months?:  (Has continued to abuse ETOH.) Other Self Harm Risks:  (Pointing a gun at her own head.) Triggers for Past Attempts: None known Intentional Self Injurious Behavior: None Factors that decrease suicide risk: Sense of responsibility to family Family Suicide History: Unknown Recent stressful life event(s): Conflict (Comment) (Increased arguements with spouse.) Persecutory voices/beliefs?: No Depression: Yes Depression Symptoms: Insomnia;Isolating;Loss of interest in usual pleasures;Feeling angry/irritable Substance abuse history and/or treatment for substance abuse?: Yes Suicide prevention information  given to non-admitted patients: Not applicable  Risk to Others Homicidal Ideation: No Thoughts of Harm to Others: No Current Homicidal Intent: No Current Homicidal Plan: No Access to Homicidal Means: No Identified Victim:  (No one) History of harm to others?: No Assessment of Violence: None Noted Violent Behavior Description:  (Patient manic but cooperative.) Does patient have access to weapons?: Yes (Comment) Criminal Charges Pending?: No Does patient have a court date: No  Mental Status Report Appear/Hygiene:  (Casual) Eye Contact: Good Motor Activity: Agitation;Restlessness Speech: Rapid;Tangential Level of Consciousness: Alert Mood: Depressed;Anxious;Sad Affect: Anxious;Depressed Anxiety Level: Moderate Thought Processes: Coherent;Irrelevant;Tangential Judgement: Impaired Orientation: Person;Place;Time;Situation Obsessive Compulsive Thoughts/Behaviors: Minimal  Cognitive Functioning Concentration: Decreased Memory: Remote Intact;Recent Impaired IQ: Average Insight: Fair Impulse Control: Poor Appetite: Good Weight Loss:  (N/A) Weight Gain:  (N/A) Sleep: No Change Total Hours of Sleep:  (<6H/D Disturbed sleep) Vegetative Symptoms: Staying in bed  Prior Inpatient/Outpatient Therapy Prior Therapy: Outpatient Prior Therapy Dates:  (Since 2004) Prior Therapy Facilty/Provider(s):  (Dr. Maudry Mayhew) Reason for Treatment:  (Bi-polar d/o)  ADL Screening (condition at time of admission) Patient's cognitive ability adequate to safely complete daily activities?: Yes Patient able to express need for assistance with ADLs?: Yes Independently performs ADLs?: Yes Weakness of Legs: None Weakness of Arms/Hands: None  Home Assistive Devices/Equipment Home Assistive Devices/Equipment: None      Values / Beliefs Cultural Requests During Hospitalization: None Spiritual Requests During Hospitalization: None        Additional Information 1:1 In Past 12 Months?:  No CIRT Risk: No Elopement Risk: No Does patient have medical clearance?: Yes     Disposition:  Disposition Disposition of Patient: Inpatient treatment program Type of inpatient treatment program: Adult (Please run)  On Site Evaluation by:   Reviewed with Physician:  Graylon Good, Rennis Harding 09/27/2011 7:33 AM

## 2011-09-28 LAB — COMPREHENSIVE METABOLIC PANEL
ALT: 41 U/L — ABNORMAL HIGH (ref 0–35)
AST: 40 U/L — ABNORMAL HIGH (ref 0–37)
Albumin: 4 g/dL (ref 3.5–5.2)
Alkaline Phosphatase: 39 U/L (ref 39–117)
Chloride: 110 mEq/L (ref 96–112)
Potassium: 3.9 mEq/L (ref 3.5–5.1)
Sodium: 139 mEq/L (ref 135–145)
Total Bilirubin: 0.1 mg/dL — ABNORMAL LOW (ref 0.3–1.2)
Total Protein: 7 g/dL (ref 6.0–8.3)

## 2011-09-28 LAB — CBC
Hemoglobin: 11 g/dL — ABNORMAL LOW (ref 12.0–15.0)
MCH: 28.8 pg (ref 26.0–34.0)
MCV: 88 fL (ref 78.0–100.0)
Platelets: 233 10*3/uL (ref 150–400)
RBC: 3.82 MIL/uL — ABNORMAL LOW (ref 3.87–5.11)
WBC: 7.2 10*3/uL (ref 4.0–10.5)

## 2011-09-28 LAB — DIFFERENTIAL
Eosinophils Absolute: 0.4 10*3/uL (ref 0.0–0.7)
Eosinophils Relative: 6 % — ABNORMAL HIGH (ref 0–5)
Lymphocytes Relative: 33 % (ref 12–46)
Lymphs Abs: 2.3 10*3/uL (ref 0.7–4.0)
Monocytes Relative: 11 % (ref 3–12)

## 2011-09-28 MED ORDER — DOCUSATE SODIUM 100 MG PO CAPS
100.0000 mg | ORAL_CAPSULE | ORAL | Status: DC
  Administered 2011-09-28 – 2011-09-30 (×4): 100 mg via ORAL
  Filled 2011-09-28 (×3): qty 1
  Filled 2011-09-28: qty 10
  Filled 2011-09-28 (×4): qty 1
  Filled 2011-09-28: qty 10

## 2011-09-28 MED ORDER — CYCLOBENZAPRINE HCL 10 MG PO TABS: 10.0000 mg | ORAL_TABLET | Freq: Every day | ORAL | Status: DC | PRN

## 2011-09-28 MED ORDER — ACETAMINOPHEN 325 MG PO TABS
650.0000 mg | ORAL_TABLET | Freq: Four times a day (QID) | ORAL | Status: DC | PRN
  Administered 2011-09-30 (×2): 650 mg via ORAL

## 2011-09-28 MED ORDER — MELOXICAM 7.5 MG PO TABS
7.5000 mg | ORAL_TABLET | ORAL | Status: DC
  Administered 2011-09-28 – 2011-09-30 (×4): 7.5 mg via ORAL
  Filled 2011-09-28 (×3): qty 1
  Filled 2011-09-28: qty 10
  Filled 2011-09-28 (×3): qty 1
  Filled 2011-09-28: qty 10

## 2011-09-28 MED ORDER — GLYCERIN (LAXATIVE) 2.1 G RE SUPP
1.0000 | RECTAL | Status: DC | PRN
  Filled 2011-09-28: qty 1

## 2011-09-28 MED ORDER — CLONAZEPAM 0.5 MG PO TABS
0.5000 mg | ORAL_TABLET | Freq: Three times a day (TID) | ORAL | Status: DC
  Administered 2011-09-28 – 2011-09-30 (×6): 0.5 mg via ORAL
  Filled 2011-09-28 (×6): qty 1

## 2011-09-28 MED ORDER — TRAMADOL HCL 50 MG PO TABS
100.0000 mg | ORAL_TABLET | Freq: Four times a day (QID) | ORAL | Status: DC | PRN
  Administered 2011-09-28 – 2011-09-30 (×5): 100 mg via ORAL
  Filled 2011-09-28: qty 2
  Filled 2011-09-28: qty 1
  Filled 2011-09-28 (×2): qty 2
  Filled 2011-09-28: qty 1
  Filled 2011-09-28: qty 2

## 2011-09-28 MED ORDER — DULOXETINE HCL 30 MG PO CPEP
30.0000 mg | ORAL_CAPSULE | Freq: Every day | ORAL | Status: DC
  Administered 2011-09-28 – 2011-09-29 (×2): 30 mg via ORAL
  Filled 2011-09-28 (×2): qty 1

## 2011-09-28 NOTE — Progress Notes (Signed)
Patient ID: Rita Bennett, female   DOB: 11/20/1965, 45 y.o.   MRN: [id number]  Patient slept 4/25 hours last night, appetite is good, normal energy level, her ability to pay attention is improving, 3/10 for the depression and hopelessness, complained of leg pains earlier, relieved by tramadol.  Pt attended groups and participated.  The MD considers her hypomanic at this time.  The patient has been cooperative and pleasant this shift.  She is interacting with other patients and staff.  She denies SI/HI/AVH.

## 2011-09-28 NOTE — Progress Notes (Signed)
Recreation Therapy Group Note  Date: 09/28/2011         Time: 0930      Group Topic/Focus: The focus of this group is on discussing various styles of communication and communicating assertively using 'I' (feeling) statements.  Participation Level: Active  Participation Quality: Attentive and Sharing  Affect: Appropriate  Cognitive: Alert   Additional Comments: Patient spoke about difficulties in her marriage, says her husband doesn't understand her condition and often gets upset when she doesn't feel like getting out of bed. Patient says she is grieving for her son, who is currently stationed in [place] and worries for his safety.  Rita Bennett 09/28/2011 1:16 PM

## 2011-09-28 NOTE — Progress Notes (Signed)
Patient ID: Rita Bennett, female   DOB: [birth year], 45 y.o.   MRN: [id number] Pt was pleasant and cooperative, but intrusive.  Was on the telephone for a lengthy amount of time with her husband. After her telephone conversation became began crying hysterically. Began talking about her son, and how he uses cocaine. Stated her husband may kick her out of the home, and won't listen to her about the son. Pt is very labile and almost stopped crying immediately after being confronted by the Clinical research associate. Support and encouragement was offered.

## 2011-09-28 NOTE — Progress Notes (Signed)
[facility] MD Progress Note  09/28/2011 4:55 PM  Diagnosis:  Axis I: Bipolar I Disorder - Mixed Type.  Polysubstance Abuse - Alcohol, Cannabis and Cocaine.   The patient was seen today and reports the following:   AL's: Intact  Sleep: The patient reports to having difficulty initiating and maintaining sleep last night.  Appetite: The patient reports a good appetite.   Mild>(1-10) >Severe  Hopelessness (1-10): 0  Depression (1-10): 3  Anxiety (1-10): 5   Suicidal Ideation: The patient adamantly denies any suicidal ideations today.  Plan: No  Intent: No  Means: No   Homicidal Ideation: The patient adamantly denies any homicidal ideations today.  Plan: No  Intent: No.  Means: No   General Appearance /Behavior: Neat and Casual  Eye Contact: Good.  Speech: Normal.  Motor Behavior: Moderately anxious.  Level of Consciousness: Alert.  Mental Status: O x 3  Mood: Mildly Depressed.  Affect: Moderately Anxious.  Anxiety Level: Moderately anxious.  Thought Process: Some mild disorganization.  Thought Content: The patient denies any auditory or visual hallucinations or delusional thinking.  Perception: Normal.  Judgment: Fair.  Insight: Fair.  Cognition: Orientation time, place and person.  Sleep:  Number of Hours: 4.25   Vital Signs:Blood pressure 121/68, pulse 91, temperature 98.4 F (36.9 C), temperature source Oral, resp. rate 18.  Lab Results:  Results for orders placed during the hospital encounter of 09/26/11 (from the past 48 hour(s))  CBC     Status: Abnormal   Collection Time   09/26/11  6:36 PM      Component Value Range Comment   WBC 7.9  4.0 - 10.5 (K/uL)    RBC 3.81 (*) 3.87 - 5.11 (MIL/uL)    Hemoglobin 11.5 (*) 12.0 - 15.0 (g/dL)    HCT 16.1 (*) 09.6 - 46.0 (%)    MCV 88.2  78.0 - 100.0 (fL)    MCH 30.2  26.0 - 34.0 (pg)    MCHC 34.2  30.0 - 36.0 (g/dL)    RDW 04.5  40.9 - 81.1 (%)    Platelets 192  150 - 400 (K/uL)   COMPREHENSIVE METABOLIC PANEL      Status: Abnormal   Collection Time   09/26/11  6:36 PM      Component Value Range Comment   Sodium 138  135 - 145 (mEq/L)    Potassium 3.3 (*) 3.5 - 5.1 (mEq/L)    Chloride 108  96 - 112 (mEq/L)    CO2 20  19 - 32 (mEq/L)    Glucose, Bld 126 (*) 70 - 99 (mg/dL)    BUN 16  6 - 23 (mg/dL)    Creatinine, Ser 9.14  0.50 - 1.10 (mg/dL)    Calcium 8.4  8.4 - 10.5 (mg/dL)    Total Protein 6.9  6.0 - 8.3 (g/dL)    Albumin 3.8  3.5 - 5.2 (g/dL)    AST 19  0 - 37 (U/L) SLIGHT HEMOLYSIS   ALT 22  0 - 35 (U/L)    Alkaline Phosphatase 40  39 - 117 (U/L)    Total Bilirubin 0.2 (*) 0.3 - 1.2 (mg/dL)    GFR calc non Af Amer >90  >90 (mL/min)    GFR calc Af Amer >90  >90 (mL/min)   ETHANOL     Status: Normal   Collection Time   09/26/11  6:36 PM      Component Value Range Comment   Alcohol, Ethyl (B) <11  0 - 11 (  mg/dL)   ACETAMINOPHEN LEVEL     Status: Normal   Collection Time   09/26/11  7:05 PM      Component Value Range Comment   Acetaminophen (Tylenol), Serum <15.0  10 - 30 (ug/mL)   SALICYLATE LEVEL     Status: Abnormal   Collection Time   09/26/11  7:05 PM      Component Value Range Comment   Salicylate Lvl <2.0 (*) 2.8 - 20.0 (mg/dL)   URINE RAPID DRUG SCREEN (HOSP PERFORMED)     Status: Abnormal   Collection Time   09/26/11 11:20 PM      Component Value Range Comment   Opiates NONE DETECTED  NONE DETECTED     Cocaine POSITIVE (*) NONE DETECTED     Benzodiazepines POSITIVE (*) NONE DETECTED     Amphetamines NONE DETECTED  NONE DETECTED     Tetrahydrocannabinol POSITIVE (*) NONE DETECTED     Barbiturates NONE DETECTED  NONE DETECTED     Treatment Plan Summary:  1. Daily contact with patient to assess and evaluate symptoms and progress in treatment  2. Medication management  3. The patient will deny suicidal ideations or homicidal ideations for 48 hours prior to discharge and have a depression and anxiety rating of 3 or less. The patient will also deny any auditory or visual  hallucinations or delusional thinking and display no manic or hypomanic behaviors.   Plan: 1. Will start Cymbalta 30 mgs po q am for depression and pain. 2. Will increase Klonopin to 0.5 mgs po AC and 2 mgs po hs for anxiety. 3. Will start Colace 100 mgs po q am and hs for stool softener. 4. Labs ordered for tonight. 5. Continue to monitor.    Randy Readling 09/28/2011, 4:55 PM

## 2011-09-29 MED ORDER — TAB-A-VITE/IRON PO TABS
1.0000 | ORAL_TABLET | Freq: Every day | ORAL | Status: DC
  Administered 2011-09-29 – 2011-09-30 (×2): 1 via ORAL
  Filled 2011-09-29 (×4): qty 1

## 2011-09-29 MED ORDER — SENNA 8.6 MG PO TABS
1.0000 | ORAL_TABLET | Freq: Every day | ORAL | Status: DC
  Administered 2011-09-29: 8.6 mg via ORAL
  Filled 2011-09-29 (×3): qty 1

## 2011-09-29 MED ORDER — SENNA 8.6 MG PO TABS: 1.0000 | ORAL_TABLET | Freq: Every day | ORAL | Status: DC

## 2011-09-29 MED ORDER — SENNA 8.6 MG PO TABS
1.0000 | ORAL_TABLET | ORAL | Status: DC
  Administered 2011-09-29 – 2011-09-30 (×2): 8.6 mg via ORAL
  Filled 2011-09-29: qty 1
  Filled 2011-09-29: qty 10
  Filled 2011-09-29: qty 1
  Filled 2011-09-29: qty 10
  Filled 2011-09-29 (×4): qty 1

## 2011-09-29 MED ORDER — ARIPIPRAZOLE 5 MG PO TABS
5.0000 mg | ORAL_TABLET | Freq: Every day | ORAL | Status: DC
  Administered 2011-09-29: 5 mg via ORAL
  Filled 2011-09-29: qty 14
  Filled 2011-09-29 (×2): qty 1

## 2011-09-29 NOTE — Progress Notes (Signed)
Hyper-verbal today.  Oriented to person, place, time, and event.  Fixated on whether or not she should go home tomorrow.  States she feels ready, but does not feel the external problems at home have been addressed and she is concerned about going back to the same situation she left.  Encouraged her to speak about her concerns during treatment team tomorrow or in the family meeting session with the case manager.  Has been cooperative and interacting with peers.  Attending all groups.

## 2011-09-29 NOTE — Progress Notes (Signed)
Pt was happy about the visit she had with her husband toady and about the call she received from her son whose away in [place]. Pt seemed to be more encouraged today. Support and encouragement was offered.

## 2011-09-29 NOTE — Progress Notes (Signed)
Counselor was asked by CM to call Abigial's husband regarding him attending treatment team tomorrow (09/30/11). Counselor checked in with attending psychiatrist and then contacted Cletus's husband, Tammy Sours. Tammy Sours stated that Marabella had already informed him of the need for him to be present at tomorrow's treatment team, and that he does plan to attend. He stated that Quintana "will need to be a lot better before she can come home" but that he will be happy to discuss that and her progress tomorrow.   Billie Lade 09/29/2011 3:57 PM

## 2011-09-29 NOTE — Progress Notes (Signed)
[facility] MD Progress Note  09/29/2011 1:13 PM  Diagnosis:  Axis I: Bipolar I Disorder - Mixed Type.  Polysubstance Abuse - Alcohol, Cannabis and Cocaine.   The patient was seen today and reports the following:   AL's: Intact  Sleep: The patient reports to sleeping well last night without difficulty.  Appetite: The patient reports a good appetite.   Mild>(1-10) >Severe  Hopelessness (1-10): 0  Depression (1-10): 3  Anxiety (1-10): 3   Suicidal Ideation: The patient adamantly denies any suicidal ideations today.  Plan: No  Intent: No  Means: No   Homicidal Ideation: The patient adamantly denies any homicidal ideations today.  Plan: No  Intent: No.  Means: No   General Appearance /Behavior: Neat and Casual. Eye Contact: Good.  Speech: Slightly pressured with much spontaneous speech..  Motor Behavior: Mildly anxious.  Level of Consciousness: Alert.  Mental Status: O x 3  Mood: Mildly Depressed.  Affect: Mildly Expansive.  Anxiety Level: Mildly anxious.  Thought Process: Some mild disorganization.  Thought Content: The patient denies any auditory or visual hallucinations or delusional thinking today.  Perception: Normal.  Judgment: Fair.  Insight: Fair.  Cognition: Orientation time, place and person.  Sleep:  Number of Hours: 4.5   Vital Signs:Blood pressure 115/82, pulse 79, temperature 98.2 F (36.8 C), temperature source Oral, resp. rate 16.  Lab Results:  Results for orders placed during the hospital encounter of 09/27/11 (from the past 48 hour(s))  COMPREHENSIVE METABOLIC PANEL     Status: Abnormal   Collection Time   09/28/11  7:51 PM      Component Value Range Comment   Sodium 139  135 - 145 (mEq/L)    Potassium 3.9  3.5 - 5.1 (mEq/L)    Chloride 110  96 - 112 (mEq/L)    CO2 22  19 - 32 (mEq/L)    Glucose, Bld 109 (*) 70 - 99 (mg/dL)    BUN 9  6 - 23 (mg/dL)    Creatinine, Ser 1.47  0.50 - 1.10 (mg/dL)    Calcium 9.1  8.4 - 10.5 (mg/dL)    Total Protein 7.0   6.0 - 8.3 (g/dL)    Albumin 4.0  3.5 - 5.2 (g/dL)    AST 40 (*) 0 - 37 (U/L)    ALT 41 (*) 0 - 35 (U/L)    Alkaline Phosphatase 39  39 - 117 (U/L)    Total Bilirubin 0.1 (*) 0.3 - 1.2 (mg/dL)    GFR calc non Af Amer 82 (*) >90 (mL/min)    GFR calc Af Amer >90  >90 (mL/min)   TSH     Status: Normal   Collection Time   09/28/11  7:51 PM      Component Value Range Comment   TSH 3.633  0.350 - 4.500 (uIU/mL)   T3, FREE     Status: Normal   Collection Time   09/28/11  7:51 PM      Component Value Range Comment   T3, Free 2.9  2.3 - 4.2 (pg/mL)   T4, FREE     Status: Normal   Collection Time   09/28/11  7:51 PM      Component Value Range Comment   Free T4 1.13  0.80 - 1.80 (ng/dL)   CBC     Status: Abnormal   Collection Time   09/28/11  7:51 PM      Component Value Range Comment   WBC 7.2  4.0 - 10.5 (K/uL)  RBC 3.82 (*) 3.87 - 5.11 (MIL/uL)    Hemoglobin 11.0 (*) 12.0 - 15.0 (g/dL)    HCT 16.1 (*) 09.6 - 46.0 (%)    MCV 88.0  78.0 - 100.0 (fL)    MCH 28.8  26.0 - 34.0 (pg)    MCHC 32.7  30.0 - 36.0 (g/dL)    RDW 04.5  40.9 - 81.1 (%)    Platelets 233  150 - 400 (K/uL)   DIFFERENTIAL     Status: Abnormal   Collection Time   09/28/11  7:51 PM      Component Value Range Comment   Neutrophils Relative 49  43 - 77 (%)    Neutro Abs 3.5  1.7 - 7.7 (K/uL)    Lymphocytes Relative 33  12 - 46 (%)    Lymphs Abs 2.3  0.7 - 4.0 (K/uL)    Monocytes Relative 11  3 - 12 (%)    Monocytes Absolute 0.8  0.1 - 1.0 (K/uL)    Eosinophils Relative 6 (*) 0 - 5 (%)    Eosinophils Absolute 0.4  0.0 - 0.7 (K/uL)    Basophils Relative 1  0 - 1 (%)    Basophils Absolute 0.1  0.0 - 0.1 (K/uL)    Treatment Plan Summary:  1. Daily contact with patient to assess and evaluate symptoms and progress in treatment  2. Medication management.  3. The patient will deny suicidal ideations or homicidal ideations for 48 hours prior to discharge and have a depression and anxiety rating of 3 or less. The patient  will also deny any auditory or visual hallucinations or delusional thinking and display no manic or hypomanic behaviors.   Plan: 1. Will discontinue Cymbalta today.  The patient has decided she does not want to be on the medication. 2. Will increase Abilify to 5 mgs po qhs for further mood stabilization. 3. Will add a Multivitamin po q am to address low iron. 4. Will continue to monitor.   Daniesha Driver 09/29/2011, 1:13 PM

## 2011-09-29 NOTE — Progress Notes (Signed)
Interdisciplinary Treatment Plan Update (Adult)  Date:  09/29/2011  Time Reviewed:  9:57 AM   Progress in Treatment: Attending groups:   Yes   Participating in groups:  Yes Taking medication as prescribed:  Yes Tolerating medication:  Yes Family/Significant other contact made: No, need contact with husband to see what patient's baseline is Patient understands diagnosis:  Yes Discussing patient identified problems/goals with staff: Yes Medical problems stabilized or resolved: Yes Denies suicidal/homicidal ideation:Yes Issues/concerns per patient self-inventory: None noted Other:  New problem(s) identified:  Wants help with having bowel movement today, has been using cocaine, marijuana, alcohol prior to admission  Reason for Continuation of Hospitalization: Medication stabilization Other; describe hypomanic, exploration of issues  Interventions implemented related to continuation of hospitalization: Medication Management; safety checks q 15 mins Therapy groups Psychoeducation Collateral contact  Additional comments:  Estimated length of stay: 1-3 days  Discharge Plan:  Return home with husband, follow up to be set  New goal(s):  Decide how/if to address substance abuse issues at discharge  Review of initial/current patient goals per problem list:   1.  Goal(s):  Eliminate SI  Met:  Yes  Target date:  D/c  As evidenced by:  Patient will report no longer suicidal, denies SI today  2.  Goal (s):  Eliminate/reduce A/VH  Met:  Yes  Target date: d/c  As evidenced by:  Mack[place]y will report no longer experiencing A/VH or less frequency of occurences, denies  3.  Goal(s):  Stabilize on meds  Met:  No  Target date:  D/c  As evidenced by:  Will report medications are working - less symptomatic; remains hypomanic    Attendees: Patient:  Did not attend   Family:     Physician:  Franchot Gallo, MD 09/29/2011 9:57 AM   Nursing:   Robbie Louis, RN 09/29/2011 9:57 AM     CaseManager:  Prepared by Juline Patch, LCSW   Counselor:     Other:  Ambrose Mantle 09/29/2011 9:57 AM   Other:  Izola Price, RN 09/29/2011 9:57 AM   Other:  Nanine Means, RN, MSN 09/29/2011 9:57 AM   Other:      Scribe for Treatment Team:   Wynn Banker, LCSW,  09/29/2011 9:57 AM

## 2011-09-30 DIAGNOSIS — F3162 Bipolar disorder, current episode mixed, moderate: Secondary | ICD-10-CM

## 2011-09-30 DIAGNOSIS — F411 Generalized anxiety disorder: Secondary | ICD-10-CM | POA: Diagnosis present

## 2011-09-30 MED ORDER — DSS 100 MG PO CAPS: 100.0000 mg | ORAL_CAPSULE | ORAL | Status: AC

## 2011-09-30 MED ORDER — TOPIRAMATE 200 MG PO TABS: 600.0000 mg | ORAL_TABLET | Freq: Every day | ORAL | Status: DC

## 2011-09-30 MED ORDER — PANTOPRAZOLE SODIUM 40 MG PO TBEC: 40.0000 mg | DELAYED_RELEASE_TABLET | Freq: Every day | ORAL | Status: DC

## 2011-09-30 MED ORDER — CLONAZEPAM 1 MG PO TABS: 0.5000 mg | ORAL_TABLET | Freq: Three times a day (TID) | ORAL | Status: AC

## 2011-09-30 MED ORDER — MELOXICAM 7.5 MG PO TABS: 7.5000 mg | ORAL_TABLET | ORAL | Status: AC

## 2011-09-30 MED ORDER — ARIPIPRAZOLE 5 MG PO TABS: 5.0000 mg | ORAL_TABLET | Freq: Every day | ORAL | Status: AC

## 2011-09-30 NOTE — Progress Notes (Signed)
[facility] Adult Inpatient Family/Significant Other Suicide Prevention Education  Suicide Prevention Education:  Education Completed;  Tammy Sours (patient's husband) has been identified by the patient as the family member/significant other with whom the patient will be residing, and identified as the person(s) who will aid the patient in the event of a mental health crisis (suicidal ideations/suicide attempt).  With written consent from the patient, the family member/significant other has been provided the following suicide prevention education, prior to the and/or following the discharge of the patient.  The suicide prevention education provided includes the following:  Suicide risk factors  Suicide prevention and interventions  National Suicide Hotline telephone number  [facility] assessment telephone number  [facility] Emergency Assistance 911  [facility] and/or Residential Mobile Crisis Unit telephone number  Request made of family/significant other to:  Remove weapons (e.g., guns, rifles, knives), all items previously/currently identified as safety concern.    Remove drugs/medications (over-the-counter, prescriptions, illicit drugs), all items previously/currently identified as a safety concern.  The family member/significant other verbalizes understanding of the suicide prevention education information provided.  The family member/significant other agrees to remove the items of safety concern listed above. Met with husband after treatment team. He had reported no concerns and stated he felt that she was ready to return home. Patient's husband stated that they have numerous guns in the house, but he has secured all of them and that patient does not have access. He reported that patient's putting a gun up to her head before admission was just for attention. He stated, however, she had done this with no safety latch and with  a loaded gun. Counselor stressed the importance of  taking her seriously and making sure that she had no access. Also discussed with him locking up and monitoring her medications. Stressed the importance of supervision for at least two weeks or until cleared by her psychiatrist, Dr. Evelene Croon.  HartisAram Beecham 09/30/2011, 12:27 PM
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
## 2011-09-30 NOTE — Progress Notes (Signed)
[facility] Group Notes:  (Counselor/Nursing/MHT/Case Management/Adjunct)  09/30/2011 12:50 PM  Type of Therapy:  Group therapy  Participation Level:  Minimal  Participation Quality:  Attentive  Affect:  Depressed  Cognitive:  Oriented  Insight:  Limited  Engagement in Group:  Limited  Engagement in Therapy:  Limited  Modes of Intervention:  Education and Support  Summary of Progress/Problems: Patient was attentive but became more focused on her husband coming for a meeting for discharge. She was called out during most of the session to speak with doctor.   Dvante Hands, Aram Beecham 09/30/2011, 12:50 PM

## 2011-09-30 NOTE — Progress Notes (Signed)
Writer met with patient who expects to discharge from hospital today.  She denies SI/HI and reports doing better.  She was informed that therapist she requested that not accept her insurance but works on a sliding fee scale.  Therapist advised that minimal cost would be $40.  Patient asked that an appointment be scheduled.  Message left on therapist voice mail.  Writer will call patient at home with follow up.  Patient scheduled with MD on 10/01/11.  Suicide prevention reviewed.

## 2011-09-30 NOTE — Progress Notes (Signed)
Patient ID: Rita Bennett, female   DOB: Jan 12, 1966, 45 y.o.   MRN: [id number] Pt. Cooperative during d/c process.  All medication and f/u appt information reviewed and pt verbalized understanding. Denies SI/HI.  All Rx's given and samples also given.  All belongings returned and pt escorted to lobby to care of husband.

## 2011-09-30 NOTE — Progress Notes (Signed)
Patient ID: Rita Bennett, female   DOB: 05-01-1966, 45 y.o.   MRN: [id number] Refer to d/c note
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
## 2011-09-30 NOTE — Progress Notes (Signed)
Suicide Risk Assessment  Discharge Assessment     Demographic factors:  Assessment Details Time of Assessment: Admission Information Obtained From: Patient Current Mental Status:  Current Mental Status:  (none) Risk Reduction Factors:  Risk Reduction Factors: Sense of responsibility to family;Religious beliefs about death;Living with another person, especially a relative  CLINICAL FACTORS:   Severe Anxiety and/or Agitation Bipolar Disorder:   Depressive phase Depression:   Anhedonia Alcohol/Substance Abuse/Dependencies More than one psychiatric diagnosis Medical Diagnoses and Treatments/Surgeries  Diagnosis:  Axis I: Bipolar I Disorder - Mixed Type.  Generalized Anxiety Disorder Polysubstance Abuse - Alcohol, Cannabis and Cocaine.   The patient was seen today and reports the following:   AL's: Intact  Sleep: The patient reports to sleeping well last night without difficulty but did "worry" about her upcoming discharge today. Appetite: The patient reports a good appetite.   Mild>(1-10) >Severe  Hopelessness (1-10): 0  Depression (1-10): 0  Anxiety (1-10): 3-4   Suicidal Ideation: The patient adamantly denies any suicidal ideations today.  Plan: No  Intent: No  Means: No   Homicidal Ideation: The patient adamantly denies any homicidal ideations today.  Plan: No  Intent: No.  Means: No  General Appearance /Behavior: Neat and Casual.  Eye Contact: Good.  Speech: Slightly pressured with much spontaneous speech..  Motor Behavior: Mildly anxious.  Level of Consciousness: Alert.  Mental Status: O x 3  Mood: Euthymic.  Affect: Bright and Full.  Anxiety Level: Mildly anxious.  Thought Process: Some mild disorganization.  Thought Content: The patient denies any auditory or visual hallucinations or delusional thinking today.  Perception: Normal.  Judgment: Fair to Good.  Insight: Fair.  Cognition: Orientation time, place and person.   Current Medications:   Reighan, Hipolito  Home Medication Instructions [id number]   Printed on:09/30/11 1416  Medication Information                    buPROPion (WELLBUTRIN SR) 200 MG 12 hr tablet Take 200 mg by mouth 2 (two) times daily.            amoxicillin (AMOXIL) 500 MG capsule Take 1,000 mg by mouth 2 (two) times daily. On course for sinus infection           cyclobenzaprine (FLEXERIL) 10 MG tablet Take 20 mg by mouth at bedtime. Muscle spasm.           traMADol (ULTRAM) 50 MG tablet Take 50 mg by mouth every 6 (six) hours as needed. Maximum dose= 8 tablets per day. pain           ARIPiprazole (ABILIFY) 5 MG tablet Take 1 tablet (5 mg total) by mouth at bedtime.           pantoprazole (PROTONIX) 40 MG tablet Take 1 tablet (40 mg total) by mouth at bedtime.           topiramate (TOPAMAX) 200 MG tablet Take 3 tablets (600 mg total) by mouth at bedtime.           meloxicam (MOBIC) 7.5 MG tablet Take 1 tablet (7.5 mg total) by mouth 2 (two) times daily in the am and at bedtime..           docusate sodium 100 MG CAPS Take 100 mg by mouth 2 (two) times daily in the am and at bedtime..           clonazePAM (KLONOPIN) 1 MG tablet Take 0.5 tablets (0.5 mg total) by mouth 3 (three)  times daily before meals. And 2 tablets at bedtime             Treatment Plan Summary:  1. Daily contact with patient to assess and evaluate symptoms and progress in treatment  2. Medication management.  3. The patient will deny suicidal ideations or homicidal ideations for 48 hours prior to discharge and have a depression and anxiety rating of 3 or less. The patient will also deny any auditory or visual hallucinations or delusional thinking and display no manic or hypomanic behaviors.   Plan:  1. Continue current medications. 2. Will continue to monitor.  3. Discharge today.  SUICIDE RISK:   Minimal: No identifiable suicidal ideation.  Patients presenting with no risk factors but with morbid ruminations; may be classified  as minimal risk based on the severity of the depressive symptoms  Rita Bennett 09/30/2011, 2:13 PM

## 2011-09-30 NOTE — Discharge Summary (Signed)
Discharge Note  Patient:  Rita Bennett is an 45 y.o., female DOB:  April 25, 1966  Date of Admission:  09/27/2011  Date of Discharge:  09/30/2011  Demographic factors: Assessment Details  Time of Assessment: Admission  Information Obtained From: Patient   Current Mental Status: Current Mental Status: AO x 3. Risk Reduction Factors: Risk Reduction Factors: Sense of responsibility to family;Religious beliefs about death;Living with another person, especially a relative.   CLINICAL FACTORS:  Severe Anxiety and/or Agitation  Bipolar Disorder: Depressive phase  Depression: Anhedonia  Alcohol/Substance Abuse/Dependencies  More than one psychiatric diagnosis  Medical Diagnoses and Treatments/Surgeries   Diagnosis:  Axis I: Bipolar I Disorder - Mixed Type.  Generalized Anxiety Disorder  Polysubstance Abuse - Alcohol, Cannabis and Cocaine.   The patient was seen today and reports the following:   Rita Bennett's: Intact  Sleep: The patient reports to sleeping well last night without difficulty but did "worry" about her upcoming discharge today.  Appetite: The patient reports a good appetite.   Mild>(1-10) >Severe  Hopelessness (1-10): 0  Depression (1-10): 0  Anxiety (1-10): 3-4   Suicidal Ideation: The patient adamantly denies any suicidal ideations today.  Plan: No  Intent: No  Means: No   Homicidal Ideation: The patient adamantly denies any homicidal ideations today.  Plan: No  Intent: No.  Means: No  General Appearance /Behavior: Neat and Casual.  Eye Contact: Good.  Speech: Slightly pressured with much spontaneous speech..  Motor Behavior: Mildly anxious.  Level of Consciousness: Alert.  Mental Status: O x 3  Mood: Euthymic.  Affect: Bright and Full.  Anxiety Level: Mildly anxious.  Thought Process: Some mild disorganization.  Thought Content: The patient denies any auditory or visual hallucinations or delusional thinking today.  Perception: Normal.  Judgment: Fair to Good.   Insight: Fair.  Cognition: Orientation time, place and person.   Discharge Medications:  Jorene, Kaylor   Home Medication Instructions  [id number]    Printed on:09/30/11 1416   Medication Information                     buPROPion (WELLBUTRIN SR) 200 MG 12 hr tablet  Take 200 mg by mouth 2 (two) times daily.           amoxicillin (AMOXIL) 500 MG capsule  Take 1,000 mg by mouth 2 (two) times daily. On course for sinus infection           cyclobenzaprine (FLEXERIL) 10 MG tablet  Take 20 mg by mouth at bedtime. Muscle spasm.           traMADol (ULTRAM) 50 MG tablet  Take 50 mg by mouth every 6 (six) hours as needed. Maximum dose= 8 tablets per day. pain           ARIPiprazole (ABILIFY) 5 MG tablet  Take 1 tablet (5 mg total) by mouth at bedtime.           pantoprazole (PROTONIX) 40 MG tablet  Take 1 tablet (40 mg total) by mouth at bedtime.           topiramate (TOPAMAX) 200 MG tablet  Take 3 tablets (600 mg total) by mouth at bedtime.           meloxicam (MOBIC) 7.5 MG tablet  Take 1 tablet (7.5 mg total) by mouth 2 (two) times daily in the am and at bedtime..           docusate sodium 100 MG CAPS  Take 100 mg by  mouth 2 (two) times daily in the am and at bedtime..           clonazePAM (KLONOPIN) 1 MG tablet  Take 0.5 tablets (0.5 mg total) by mouth 3 (three) times daily before meals. And 2 tablets at bedtime            Level of Care:  OP  Discharge destination:  Home  Is patient on multiple antipsychotic therapies at discharge:  No    Has Patient had three or more failed trials of antipsychotic monotherapy by history:  No  Patient phone:  [phone] (home)  Patient address:   [address] [place] [address],   Follow-up recommendations:  Other:  Keep all scheduled follow-up appointments and take all medications only as prescribed.  The patient received suicide prevention pamphlet:  Yes Belongings returned:  Clothing, Medications and Valuables  Treatment  Plan Summary:  1. Daily contact with patient to assess and evaluate symptoms and progress in treatment  2. Medication management.  3. The patient will deny suicidal ideations or homicidal ideations for 48 hours prior to discharge and have a depression and anxiety rating of 3 or less. The patient will also deny any auditory or visual hallucinations or delusional thinking and display no manic or hypomanic behaviors.   Plan:  1. Continue current medications.  2. Will continue to monitor.  3. Discharge today.   SUICIDE RISK:  Minimal: No identifiable suicidal ideation. Patients presenting with no risk factors but with morbid ruminations; may be classified as minimal risk based on the severity of the depressive symptoms  Rita Bennett 09/30/2011, 2:31 PM

## 2011-12-20 DIAGNOSIS — M67919 Unspecified disorder of synovium and tendon, unspecified shoulder: Secondary | ICD-10-CM | POA: Diagnosis not present

## 2011-12-20 DIAGNOSIS — M719 Bursopathy, unspecified: Secondary | ICD-10-CM | POA: Diagnosis not present

## 2011-12-20 DIAGNOSIS — K219 Gastro-esophageal reflux disease without esophagitis: Secondary | ICD-10-CM | POA: Diagnosis not present

## 2011-12-20 DIAGNOSIS — IMO0001 Reserved for inherently not codable concepts without codable children: Secondary | ICD-10-CM | POA: Diagnosis not present

## 2011-12-20 DIAGNOSIS — M2550 Pain in unspecified joint: Secondary | ICD-10-CM | POA: Diagnosis not present

## 2011-12-21 DIAGNOSIS — M719 Bursopathy, unspecified: Secondary | ICD-10-CM | POA: Diagnosis not present

## 2012-08-01 DIAGNOSIS — K219 Gastro-esophageal reflux disease without esophagitis: Secondary | ICD-10-CM | POA: Diagnosis not present

## 2012-08-01 DIAGNOSIS — M2550 Pain in unspecified joint: Secondary | ICD-10-CM | POA: Diagnosis not present

## 2012-08-01 DIAGNOSIS — IMO0001 Reserved for inherently not codable concepts without codable children: Secondary | ICD-10-CM | POA: Diagnosis not present

## 2012-08-01 DIAGNOSIS — L02219 Cutaneous abscess of trunk, unspecified: Secondary | ICD-10-CM | POA: Diagnosis not present

## 2012-09-01 DIAGNOSIS — G43119 Migraine with aura, intractable, without status migrainosus: Secondary | ICD-10-CM | POA: Diagnosis not present

## 2012-12-07 ENCOUNTER — Encounter (HOSPITAL_COMMUNITY): Payer: Self-pay | Admitting: *Deleted

## 2012-12-07 DIAGNOSIS — Z87891 Personal history of nicotine dependence: Secondary | ICD-10-CM | POA: Diagnosis not present

## 2012-12-07 DIAGNOSIS — M2550 Pain in unspecified joint: Secondary | ICD-10-CM | POA: Diagnosis not present

## 2012-12-07 DIAGNOSIS — F319 Bipolar disorder, unspecified: Secondary | ICD-10-CM | POA: Insufficient documentation

## 2012-12-07 DIAGNOSIS — F411 Generalized anxiety disorder: Secondary | ICD-10-CM | POA: Insufficient documentation

## 2012-12-07 DIAGNOSIS — Z8739 Personal history of other diseases of the musculoskeletal system and connective tissue: Secondary | ICD-10-CM | POA: Insufficient documentation

## 2012-12-07 DIAGNOSIS — L03119 Cellulitis of unspecified part of limb: Secondary | ICD-10-CM | POA: Insufficient documentation

## 2012-12-07 DIAGNOSIS — IMO0001 Reserved for inherently not codable concepts without codable children: Secondary | ICD-10-CM | POA: Insufficient documentation

## 2012-12-07 DIAGNOSIS — K5900 Constipation, unspecified: Secondary | ICD-10-CM | POA: Diagnosis not present

## 2012-12-07 DIAGNOSIS — Z79899 Other long term (current) drug therapy: Secondary | ICD-10-CM | POA: Insufficient documentation

## 2012-12-07 DIAGNOSIS — R21 Rash and other nonspecific skin eruption: Secondary | ICD-10-CM | POA: Insufficient documentation

## 2012-12-07 DIAGNOSIS — R079 Chest pain, unspecified: Secondary | ICD-10-CM | POA: Diagnosis not present

## 2012-12-07 DIAGNOSIS — L02419 Cutaneous abscess of limb, unspecified: Secondary | ICD-10-CM | POA: Insufficient documentation

## 2012-12-07 DIAGNOSIS — R52 Pain, unspecified: Secondary | ICD-10-CM | POA: Insufficient documentation

## 2012-12-07 DIAGNOSIS — R141 Gas pain: Secondary | ICD-10-CM | POA: Diagnosis not present

## 2012-12-07 DIAGNOSIS — R635 Abnormal weight gain: Secondary | ICD-10-CM | POA: Diagnosis not present

## 2012-12-07 LAB — CBC WITH DIFFERENTIAL/PLATELET
Basophils Absolute: 0.1 10*3/uL (ref 0.0–0.1)
Eosinophils Relative: 6 % — ABNORMAL HIGH (ref 0–5)
HCT: 34 % — ABNORMAL LOW (ref 36.0–46.0)
Hemoglobin: 11.3 g/dL — ABNORMAL LOW (ref 12.0–15.0)
Lymphocytes Relative: 26 % (ref 12–46)
MCHC: 33.2 g/dL (ref 30.0–36.0)
MCV: 89 fL (ref 78.0–100.0)
Monocytes Absolute: 0.7 10*3/uL (ref 0.1–1.0)
Monocytes Relative: 10 % (ref 3–12)
RDW: 13.1 % (ref 11.5–15.5)
WBC: 6.6 10*3/uL (ref 4.0–10.5)

## 2012-12-07 LAB — URINALYSIS, ROUTINE W REFLEX MICROSCOPIC
Ketones, ur: NEGATIVE mg/dL
Leukocytes, UA: NEGATIVE
Nitrite: NEGATIVE
Protein, ur: NEGATIVE mg/dL
Urobilinogen, UA: 0.2 mg/dL (ref 0.0–1.0)

## 2012-12-07 NOTE — ED Notes (Signed)
The pt has had swelling bi-laterally since yesterday with a weight gain and her abd iis swollen and is usually flat.  Some pain with walking in her legs

## 2012-12-08 ENCOUNTER — Emergency Department (HOSPITAL_COMMUNITY)
Admit: 2012-12-08 | Discharge: 2012-12-08 | Disposition: A | Discharge status: Home / Self Care | Payer: Medicare Other | Attending: Emergency Medicine | Admitting: Emergency Medicine

## 2012-12-08 ENCOUNTER — Emergency Department (HOSPITAL_COMMUNITY): Payer: Medicare Other

## 2012-12-08 ENCOUNTER — Emergency Department (HOSPITAL_COMMUNITY)
Admission: EM | Admit: 2012-12-08 | Discharge: 2012-12-08 | Disposition: A | Discharge status: Home / Self Care | Payer: Medicare Other | Attending: Emergency Medicine | Admitting: Emergency Medicine

## 2012-12-08 DIAGNOSIS — K5900 Constipation, unspecified: Secondary | ICD-10-CM

## 2012-12-08 DIAGNOSIS — L0390 Cellulitis, unspecified: Secondary | ICD-10-CM

## 2012-12-08 DIAGNOSIS — R141 Gas pain: Secondary | ICD-10-CM | POA: Diagnosis not present

## 2012-12-08 DIAGNOSIS — R635 Abnormal weight gain: Secondary | ICD-10-CM | POA: Diagnosis not present

## 2012-12-08 DIAGNOSIS — R143 Flatulence: Secondary | ICD-10-CM | POA: Diagnosis not present

## 2012-12-08 DIAGNOSIS — R079 Chest pain, unspecified: Secondary | ICD-10-CM | POA: Diagnosis not present

## 2012-12-08 LAB — COMPREHENSIVE METABOLIC PANEL
AST: 41 U/L — ABNORMAL HIGH (ref 0–37)
BUN: 23 mg/dL (ref 6–23)
CO2: 26 mEq/L (ref 19–32)
Calcium: 8.6 mg/dL (ref 8.4–10.5)
Creatinine, Ser: 0.7 mg/dL (ref 0.50–1.10)
GFR calc Af Amer: >90 mL/min (ref >90)
GFR calc non Af Amer: >90 mL/min (ref >90)
Total Bilirubin: 0.1 mg/dL — ABNORMAL LOW (ref 0.3–1.2)

## 2012-12-08 LAB — TROPONIN I: Troponin I: <0.3 ng/mL (ref <0.30)

## 2012-12-08 MED ORDER — SODIUM CHLORIDE 0.9 % IV BOLUS (SEPSIS)
250.0000 mL | Freq: Once | INTRAVENOUS | Status: AC
  Administered 2012-12-08: 250 mL via INTRAVENOUS

## 2012-12-08 MED ORDER — ONDANSETRON HCL 4 MG/2ML IJ SOLN
4.0000 mg | Freq: Once | INTRAMUSCULAR | Status: AC
  Administered 2012-12-08: 4 mg via INTRAVENOUS
  Filled 2012-12-08: qty 2

## 2012-12-08 MED ORDER — IOHEXOL 300 MG/ML  SOLN
20.0000 mL | INTRAMUSCULAR | Status: AC
  Administered 2012-12-08: 50 mL via ORAL

## 2012-12-08 MED ORDER — HYDROMORPHONE HCL PF 1 MG/ML IJ SOLN
1.0000 mg | Freq: Once | INTRAMUSCULAR | Status: AC
  Administered 2012-12-08: 1 mg via INTRAVENOUS
  Filled 2012-12-08: qty 1

## 2012-12-08 MED ORDER — BISACODYL 5 MG PO TBEC: 5.0000 mg | DELAYED_RELEASE_TABLET | Freq: Every day | ORAL | Status: DC | PRN

## 2012-12-08 MED ORDER — DOXYCYCLINE HYCLATE 100 MG PO CAPS: 100.0000 mg | ORAL_CAPSULE | Freq: Two times a day (BID) | ORAL | Status: DC

## 2012-12-08 MED ORDER — SODIUM CHLORIDE 0.9 % IV SOLN
INTRAVENOUS | Status: DC
  Administered 2012-12-08: 05:00:00 via INTRAVENOUS

## 2012-12-08 MED ORDER — IOHEXOL 300 MG/ML  SOLN
100.0000 mL | Freq: Once | INTRAMUSCULAR | Status: AC | PRN
  Administered 2012-12-08: 100 mL via INTRAVENOUS

## 2012-12-08 NOTE — ED Provider Notes (Signed)
History     CSN: [id number]  Arrival date & time 12/07/12  2311   First MD Initiated Contact with Patient 12/08/12 [phone]      Chief Complaint  Patient presents with  . edema in legs     (Consider location/radiation/quality/duration/timing/severity/associated sxs/prior treatment) The history is provided by the patient.   patient is a 47 year old female primary care doctors Dr. Anna Genre in [place]. Comes in with the complaint of bilateral lower extremity swelling started yesterday. And abdominal swelling. Some pain with walking in her legs. There is some redness to her legs. Patient has a history of eyebrow chin overall does have some bodyaches all over. Patient also has a history of arthritis. Denies fever. No bowel pain. No nausea vomiting or diarrhea.  Past Medical History  Diagnosis Date  . Bipolar 1 disorder   . Fibromyalgia   . Arthritis   . Anxiety     Past Surgical History  Procedure Laterality Date  . Abdominal hysterectomy      No family history on file.  History  Substance Use Topics  . Smoking status: Former Smoker    Quit date: 12/18/2009  . Smokeless tobacco: Not on file  . Alcohol Use: 0.0 oz/week    3-7 Cans of beer per week    OB History   Grav Para Term Preterm Abortions TAB SAB Ect Mult Living                  Review of Systems  Constitutional: Positive for fatigue. Negative for fever.  HENT: Negative for congestion.   Eyes: Negative for visual disturbance.  Respiratory: Negative for shortness of breath.   Cardiovascular: Negative for chest pain.  Gastrointestinal: Positive for abdominal distention. Negative for nausea, vomiting and abdominal pain.  Genitourinary: Negative for dysuria.  Musculoskeletal: Positive for myalgias and arthralgias. Negative for back pain.  Skin: Positive for rash.  Neurological: Negative for headaches.  Hematological: Does not bruise/bleed easily.    Allergies  Review of patient's allergies  indicates no known allergies.  Home Medications   Current Outpatient Rx  Name  Route  Sig  Dispense  Refill  . amphetamine-dextroamphetamine (ADDERALL) 10 MG tablet   Oral   Take 10 mg by mouth 3 (three) times daily.         Marland Kitchen buPROPion (WELLBUTRIN SR) 200 MG 12 hr tablet   Oral   Take 200 mg by mouth 2 (two) times daily.          . clonazePAM (KLONOPIN) 1 MG tablet   Oral   Take 1 mg by mouth 4 (four) times daily.         . cyclobenzaprine (FLEXERIL) 10 MG tablet   Oral   Take 20 mg by mouth at bedtime. Muscle spasm.         . docusate sodium (COLACE) 100 MG capsule   Oral   Take 100 mg by mouth 2 (two) times daily.         Marland Kitchen HYDROCODONE-ACETAMINOPHEN PO   Oral   Take 1 tablet by mouth every 6 (six) hours as needed (for pain).         . meloxicam (MOBIC) 15 MG tablet   Oral   Take 15 mg by mouth daily.         . pantoprazole (PROTONIX) 40 MG tablet   Oral   Take 1 tablet (40 mg total) by mouth at bedtime.   30 tablet   0   .  topiramate (TOPAMAX) 200 MG tablet   Oral   Take 3 tablets (600 mg total) by mouth at bedtime.   90 tablet   0   . traMADol (ULTRAM) 50 MG tablet   Oral   Take 50 mg by mouth every 6 (six) hours as needed for pain. Maximum dose= 8 tablets per day. pain         . bisacodyl (DULCOLAX) 5 MG EC tablet   Oral   Take 1 tablet (5 mg total) by mouth daily as needed for constipation.   14 tablet   1   . doxycycline (VIBRAMYCIN) 100 MG capsule   Oral   Take 1 capsule (100 mg total) by mouth 2 (two) times daily.   14 capsule   0     BP 96/60  Pulse 82  Temp(Src) 97.5 F (36.4 C) (Oral)  Resp 16  SpO2 100%  Physical Exam  Vitals reviewed. Constitutional: She is oriented to person, place, and time. She appears well-developed and well-nourished. No distress.  HENT:  Head: Normocephalic and atraumatic.  Mouth/Throat: Oropharynx is clear and moist.  Eyes: Conjunctivae and EOM are normal. Pupils are equal, round, and  reactive to light.  Neck: Normal range of motion. Neck supple.  Cardiovascular: Normal rate, regular rhythm, normal heart sounds and intact distal pulses.   No murmur heard. Pulmonary/Chest: Effort normal and breath sounds normal.  Abdominal: Soft. Bowel sounds are normal. She exhibits distension. There is no tenderness.  Musculoskeletal: Normal range of motion.  The lower trimming the tenderness. There is bilateral redness from the proximal foot up to the knees. Increased bilateral warmth around the knees. No effusion. The right lower extremity has a few scrapes on the shin which could be a source for cellulitis but does not is to explain the left lower trimming. The increased warmth is predominantly around the knees. There is a 5 good cap refill to both feet sensations intact her cells pedis pulses 2+.  Neurological: She is alert and oriented to person, place, and time. No cranial nerve deficit. She exhibits normal muscle tone. Coordination normal.  Skin: Skin is warm. There is erythema.    ED Course  Procedures (including critical care time)  Labs Reviewed  CBC WITH DIFFERENTIAL - Abnormal; Notable for the following:    RBC 3.82 (*)    Hemoglobin 11.3 (*)    HCT 34.0 (*)    Eosinophils Relative 6 (*)    All other components within normal limits  COMPREHENSIVE METABOLIC PANEL - Abnormal; Notable for the following:    Albumin 3.3 (*)    AST 41 (*)    ALT 56 (*)    Total Bilirubin 0.1 (*)    All other components within normal limits  URINALYSIS, ROUTINE W REFLEX MICROSCOPIC - Abnormal; Notable for the following:    APPearance CLOUDY (*)    All other components within normal limits  TROPONIN I   Dg Chest 2 View  12/08/2012  *RADIOLOGY REPORT*  Clinical Data: Bilateral lower extremity swelling and pain.  CHEST - 2 VIEW  Comparison: None.  Findings: The lungs are well-aerated and clear.  There is no evidence of focal opacification, pleural effusion or pneumothorax. Bilateral nipple  shadows are seen.  The heart is normal in size; the mediastinal contour is within normal limits.  No acute osseous abnormalities are seen.  IMPRESSION: No acute cardiopulmonary process seen.   Original Report Authenticated By: Tonia Ghent, M.D.    Ct Abdomen Pelvis W Contrast  12/08/2012  *RADIOLOGY REPORT*  Clinical Data: Abdominal distension and weight gain.  CT ABDOMEN AND PELVIS WITH CONTRAST  Technique:  Multidetector CT imaging of the abdomen and pelvis was performed following the standard protocol during bolus administration of intravenous contrast.  Contrast: OMNIPAQUE IOHEXOL 300 MG/ML  SOLN  Comparison: None.  Findings: Minimal bibasilar atelectasis is noted.  The liver and spleen are unremarkable in appearance.  The gallbladder is within normal limits.  The pancreas and adrenal glands are unremarkable.  A 5 mm hypodensity at the inferior pole of the right kidney may reflect a small cyst.  The kidneys are otherwise unremarkable in appearance.  There is no evidence of hydronephrosis.  No renal or ureteral stones are seen.  No perinephric stranding is appreciated.  No free fluid is identified.  The small bowel is unremarkable in appearance.  The stomach is within normal limits.  No acute vascular abnormalities are seen.  A circumaortic left renal vein is noted.  The appendix is largely filled with stool and is grossly unremarkable in appearance.  The colon is largely filled with stool, compatible with mild constipation.  Mild wall thickening along the rectum may reflect mild chronic inflammation.  The bladder is moderately distended and grossly unremarkable in appearance.  The patient is status post hysterectomy.  No suspicious adnexal masses are seen.  No inguinal lymphadenopathy is seen.  No acute osseous abnormalities are identified.  IMPRESSION:  1.  Colon largely filled with stool, compatible with mild constipation.  Mild wall thickening along the rectum may reflect mild chronic inflammation.  2.  Likely small right renal cyst. 3.  Circumaortic left renal vein noted.   Original Report Authenticated By: Tonia Ghent, M.D.    Results for orders placed during the hospital encounter of 12/08/12  CBC WITH DIFFERENTIAL      Result Value Range   WBC 6.6  4.0 - 10.5 K/uL   RBC 3.82 (*) 3.87 - 5.11 MIL/uL   Hemoglobin 11.3 (*) 12.0 - 15.0 g/dL   HCT 78.2 (*) 95.6 - 21.3 %   MCV 89.0  78.0 - 100.0 fL   MCH 29.6  26.0 - 34.0 pg   MCHC 33.2  30.0 - 36.0 g/dL   RDW 08.6  57.8 - 46.9 %   Platelets 180  150 - 400 K/uL   Neutrophils Relative 58  43 - 77 %   Neutro Abs 3.8  1.7 - 7.7 K/uL   Lymphocytes Relative 26  12 - 46 %   Lymphs Abs 1.7  0.7 - 4.0 K/uL   Monocytes Relative 10  3 - 12 %   Monocytes Absolute 0.7  0.1 - 1.0 K/uL   Eosinophils Relative 6 (*) 0 - 5 %   Eosinophils Absolute 0.4  0.0 - 0.7 K/uL   Basophils Relative 1  0 - 1 %   Basophils Absolute 0.1  0.0 - 0.1 K/uL  COMPREHENSIVE METABOLIC PANEL      Result Value Range   Sodium 141  135 - 145 mEq/L   Potassium 3.9  3.5 - 5.1 mEq/L   Chloride 108  96 - 112 mEq/L   CO2 26  19 - 32 mEq/L   Glucose, Bld 90  70 - 99 mg/dL   BUN 23  6 - 23 mg/dL   Creatinine, Ser 6.29  0.50 - 1.10 mg/dL   Calcium 8.6  8.4 - 52.8 mg/dL   Total Protein 6.3  6.0 - 8.3 g/dL   Albumin 3.3 (*)  3.5 - 5.2 g/dL   AST 41 (*) 0 - 37 U/L   ALT 56 (*) 0 - 35 U/L   Alkaline Phosphatase 47  39 - 117 U/L   Total Bilirubin 0.1 (*) 0.3 - 1.2 mg/dL   GFR calc non Af Amer >90  >90 mL/min   GFR calc Af Amer >90  >90 mL/min  TROPONIN I      Result Value Range   Troponin I <0.30  <0.30 ng/mL  URINALYSIS, ROUTINE W REFLEX MICROSCOPIC      Result Value Range   Color, Urine YELLOW  YELLOW   APPearance CLOUDY (*) CLEAR   Specific Gravity, Urine 1.024  1.005 - 1.030   pH 7.0  5.0 - 8.0   Glucose, UA NEGATIVE  NEGATIVE mg/dL   Hgb urine dipstick NEGATIVE  NEGATIVE   Bilirubin Urine NEGATIVE  NEGATIVE   Ketones, ur NEGATIVE  NEGATIVE mg/dL   Protein, ur  NEGATIVE  NEGATIVE mg/dL   Urobilinogen, UA 0.2  0.0 - 1.0 mg/dL   Nitrite NEGATIVE  NEGATIVE   Leukocytes, UA NEGATIVE  NEGATIVE      1. Constipation   2. Cellulitis       MDM   Patient improved in the emergency department. CT scan not consistent with constipation that explains abdominal distention no evidence of obstruction. Patient without rectal pain currently. Patient has primary care Dr. followup with. The other concern was the bilateral lower extremity redness which may represent a cellulitis lots of warmth around the knees which could represent her arthritis. Her other body pains consistent with her fibromyalgia. We'll go ahead and treat with doxycycline in case there is lower trimming the cellulitis but there is no leukocytosis no fever. Patient nontoxic no acute distress at discharge. Patient will be sent home with stool softener and recommendation use to green [employer] enemas today.        Shelda Jakes, MD 12/08/12 [phone]

## 2012-12-08 NOTE — ED Notes (Signed)
1st cup of oral contrast completed and CT called.

## 2012-12-11 DIAGNOSIS — R21 Rash and other nonspecific skin eruption: Secondary | ICD-10-CM | POA: Diagnosis not present

## 2012-12-11 DIAGNOSIS — M2550 Pain in unspecified joint: Secondary | ICD-10-CM | POA: Diagnosis not present

## 2012-12-11 DIAGNOSIS — R0789 Other chest pain: Secondary | ICD-10-CM | POA: Diagnosis not present

## 2012-12-11 DIAGNOSIS — R609 Edema, unspecified: Secondary | ICD-10-CM | POA: Diagnosis not present

## 2012-12-18 DIAGNOSIS — M2550 Pain in unspecified joint: Secondary | ICD-10-CM | POA: Diagnosis not present

## 2012-12-18 DIAGNOSIS — M719 Bursopathy, unspecified: Secondary | ICD-10-CM | POA: Diagnosis not present

## 2012-12-18 DIAGNOSIS — M67919 Unspecified disorder of synovium and tendon, unspecified shoulder: Secondary | ICD-10-CM | POA: Diagnosis not present

## 2012-12-18 DIAGNOSIS — R21 Rash and other nonspecific skin eruption: Secondary | ICD-10-CM | POA: Diagnosis not present

## 2012-12-18 DIAGNOSIS — R609 Edema, unspecified: Secondary | ICD-10-CM | POA: Diagnosis not present

## 2013-04-04 DIAGNOSIS — IMO0001 Reserved for inherently not codable concepts without codable children: Secondary | ICD-10-CM | POA: Diagnosis not present

## 2013-04-04 DIAGNOSIS — K219 Gastro-esophageal reflux disease without esophagitis: Secondary | ICD-10-CM | POA: Diagnosis not present

## 2013-04-04 DIAGNOSIS — M2550 Pain in unspecified joint: Secondary | ICD-10-CM | POA: Diagnosis not present

## 2013-06-20 DIAGNOSIS — M67919 Unspecified disorder of synovium and tendon, unspecified shoulder: Secondary | ICD-10-CM | POA: Diagnosis not present

## 2013-06-20 DIAGNOSIS — N952 Postmenopausal atrophic vaginitis: Secondary | ICD-10-CM | POA: Diagnosis not present

## 2013-08-09 DIAGNOSIS — Z1231 Encounter for screening mammogram for malignant neoplasm of breast: Secondary | ICD-10-CM | POA: Diagnosis not present

## 2013-10-04 DIAGNOSIS — IMO0001 Reserved for inherently not codable concepts without codable children: Secondary | ICD-10-CM | POA: Diagnosis not present

## 2013-10-04 DIAGNOSIS — N952 Postmenopausal atrophic vaginitis: Secondary | ICD-10-CM | POA: Diagnosis not present

## 2013-10-04 DIAGNOSIS — K219 Gastro-esophageal reflux disease without esophagitis: Secondary | ICD-10-CM | POA: Diagnosis not present

## 2013-10-04 DIAGNOSIS — M2550 Pain in unspecified joint: Secondary | ICD-10-CM | POA: Diagnosis not present

## 2014-02-07 IMAGING — CT CT ABD-PELV W/ CM
2 of 5 series · 14 of 32 positions shown, 19 images · IV contrast (water/omni  & 100ml omni 300)
Comparison: None.

CLINICAL DATA: Abdominal distension and weight gain.

CT ABDOMEN AND PELVIS WITH CONTRAST
TECHNIQUE: Multidetector CT imaging of the abdomen and pelvis was
performed following the standard protocol during bolus
administration of intravenous contrast.
Contrast: 100mL OMNIPAQUE IOHEXOL 300 MG/ML  SOLN

[Series 2: routine abdomen · axial · 0.70mm/px · z∈[-382,-47]mm · 7 of 91 slices shown, 12 images]
[im 12/91  soft-tissue]
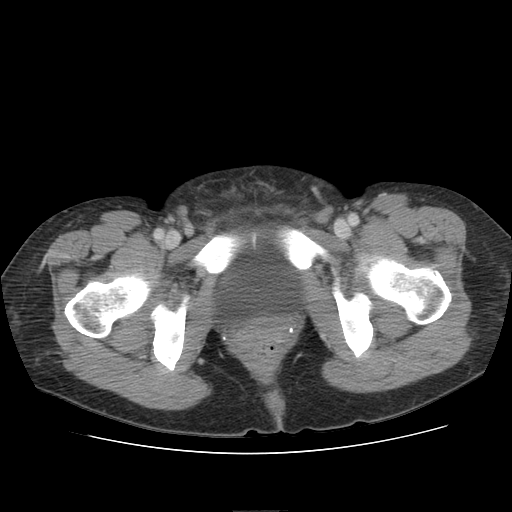
[im 12/91  bone]
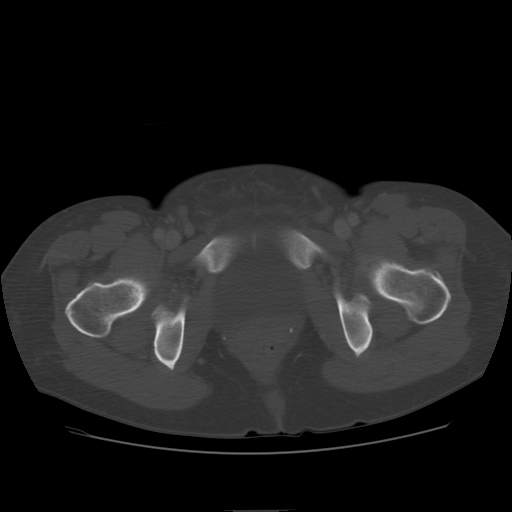
[im 23/91  soft-tissue]
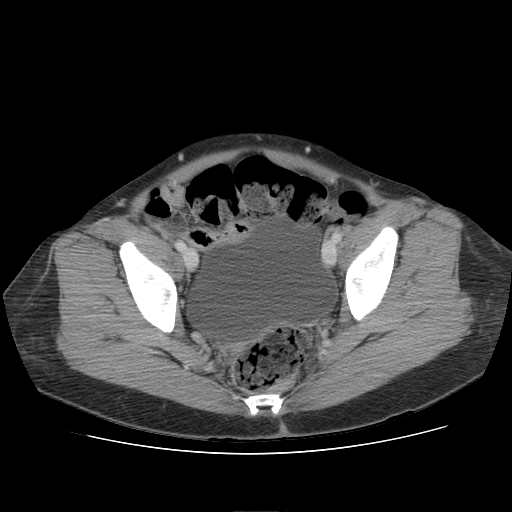
[im 34/91  soft-tissue]
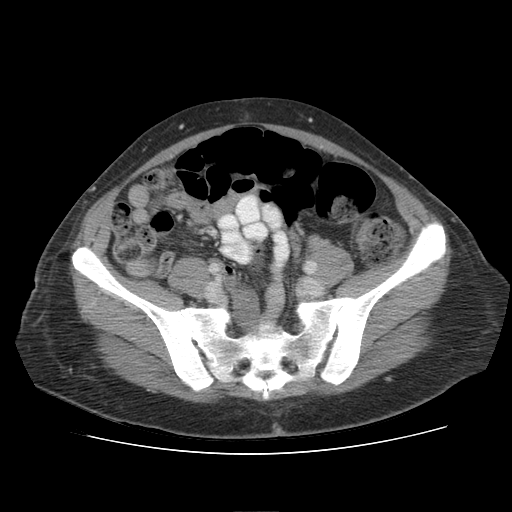
[im 46/91  soft-tissue]
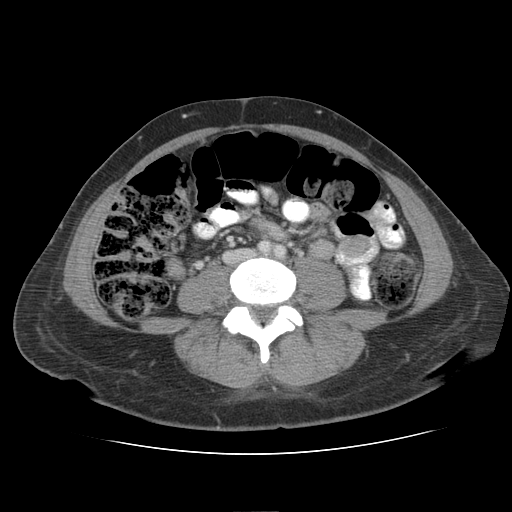
[im 46/91  lung]
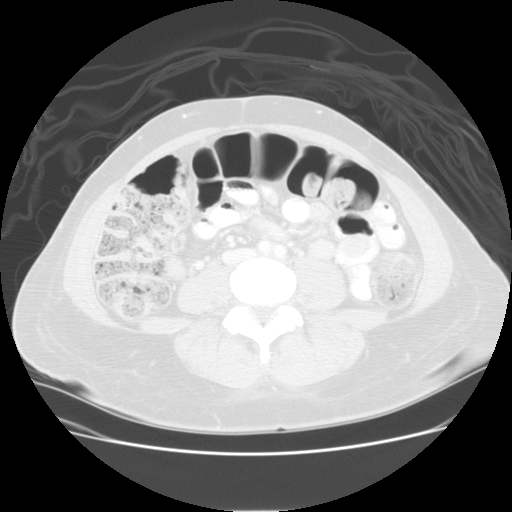
[im 57/91  soft-tissue]
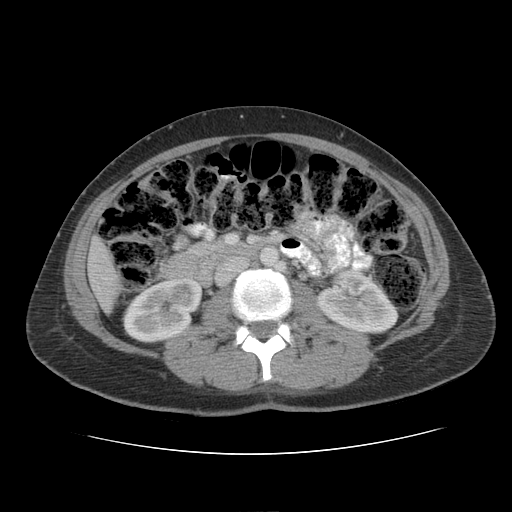
[im 57/91  lung]
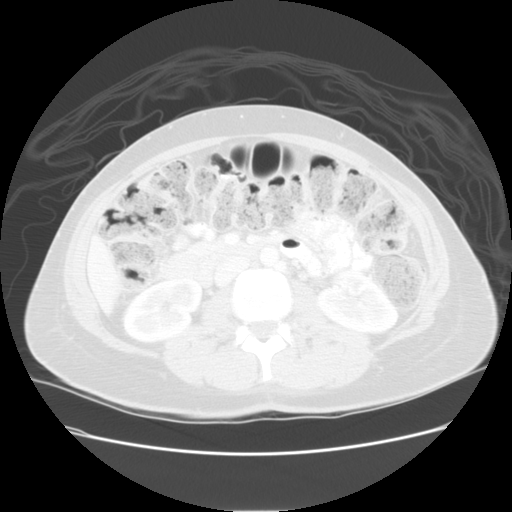
[im 68/91  soft-tissue]
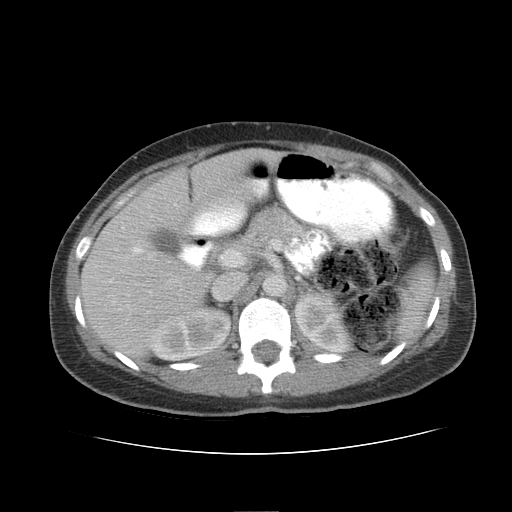
[im 68/91  lung]
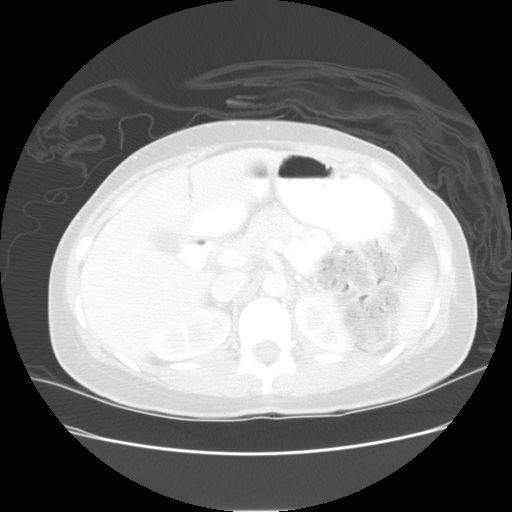
[im 79/91  soft-tissue]
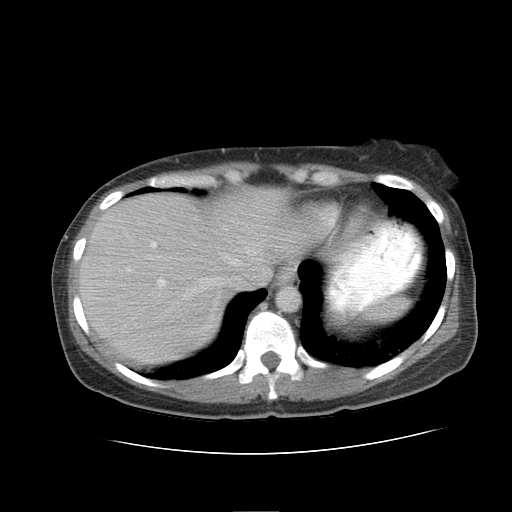
[im 79/91  lung]
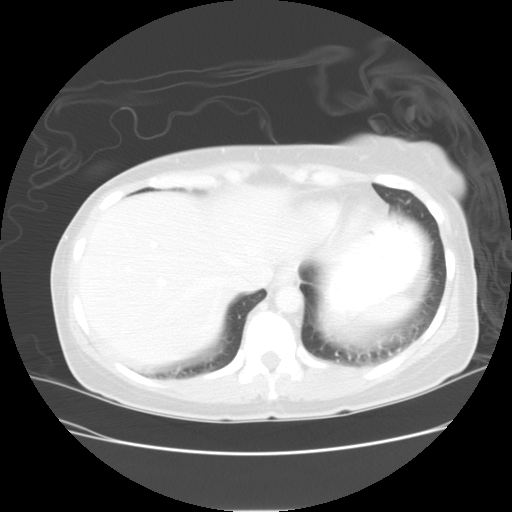

[Series 401: sag · sagittal · 0.91mm/px · 7 of 94 slices shown]
[im 12/94  soft-tissue]
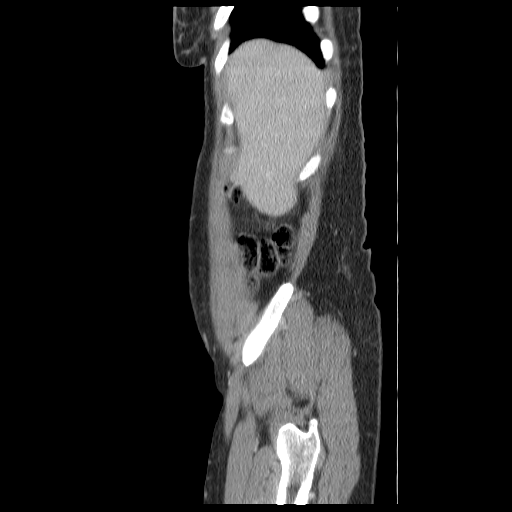
[im 24/94  soft-tissue]
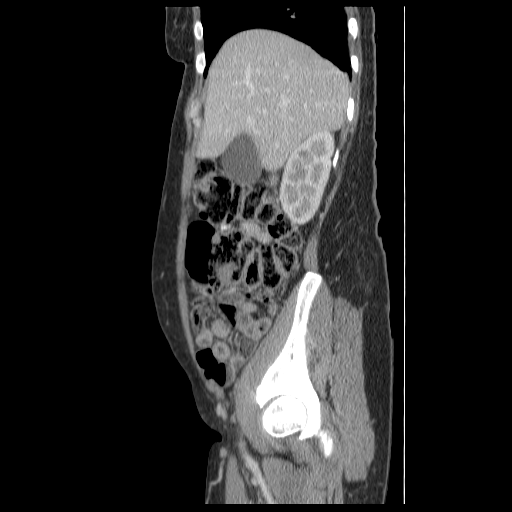
[im 35/94  soft-tissue]
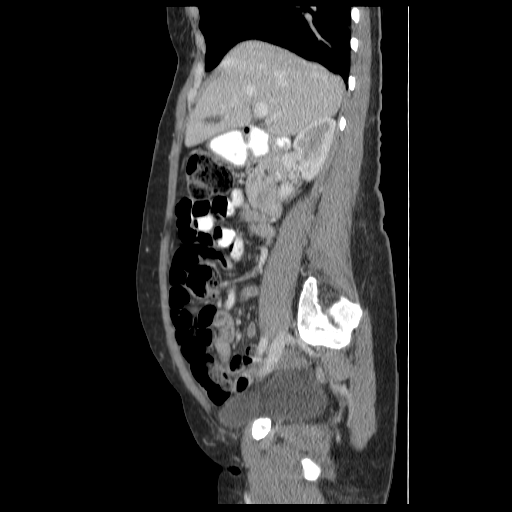
[im 47/94  soft-tissue]
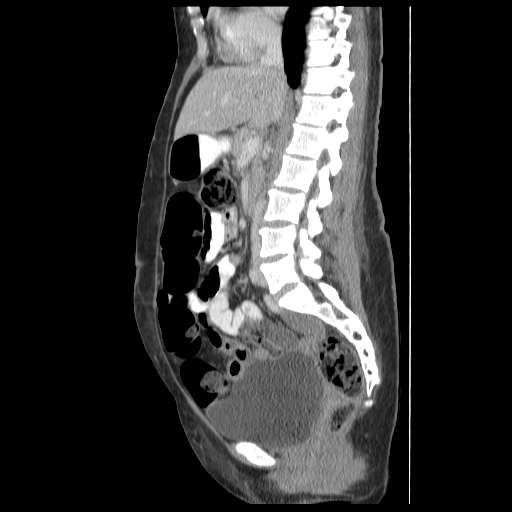
[im 59/94  soft-tissue]
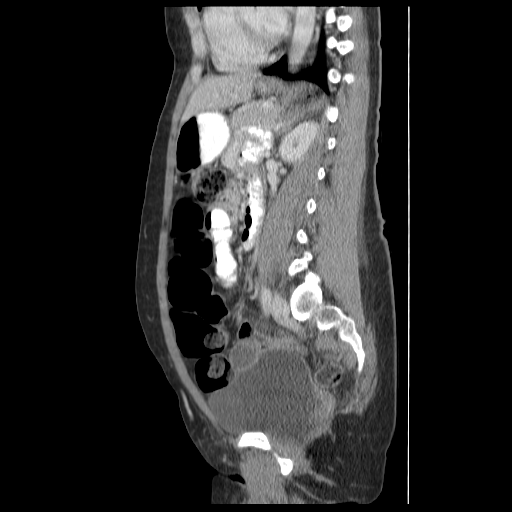
[im 70/94  soft-tissue]
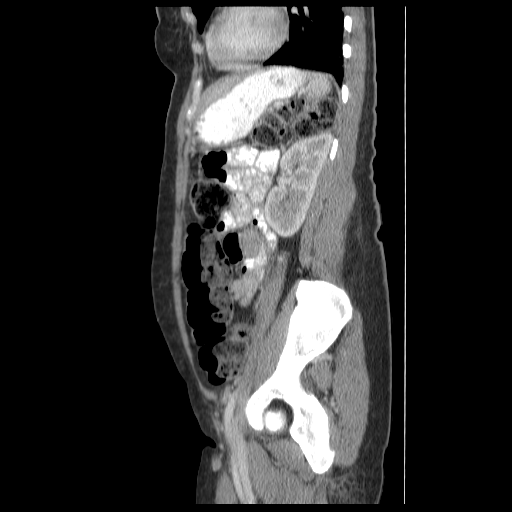
[im 82/94  soft-tissue]
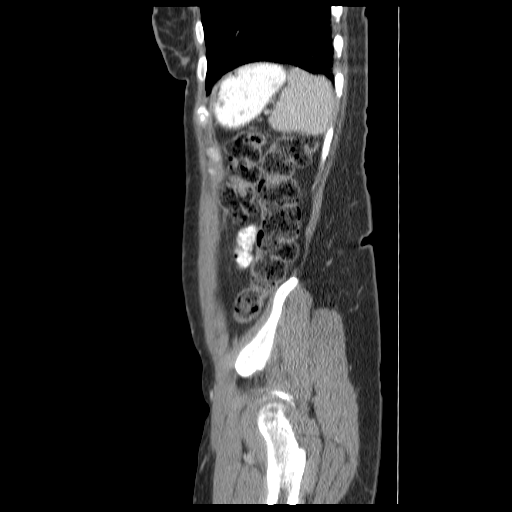

[14 of 32 positions shown; findings below may reference images not displayed]

FINDINGS: Minimal bibasilar atelectasis is noted.

The liver and spleen are unremarkable in appearance.  The
gallbladder is within normal limits.  The pancreas and adrenal
glands are unremarkable.

A 5 mm hypodensity at the inferior pole of the right kidney may
reflect a small cyst.  The kidneys are otherwise unremarkable in
appearance.  There is no evidence of hydronephrosis.  No renal or
ureteral stones are seen.  No perinephric stranding is appreciated.

No free fluid is identified.  The small bowel is unremarkable in
appearance.  The stomach is within normal limits.  No acute
vascular abnormalities are seen.  A circumaortic left renal vein is
noted.

The appendix is largely filled with stool and is grossly
unremarkable in appearance.  The colon is largely filled with
stool, compatible with mild constipation.  Mild wall thickening
along the rectum may reflect mild chronic inflammation.

The bladder is moderately distended and grossly unremarkable in
appearance.  The patient is status post hysterectomy.  No
suspicious adnexal masses are seen.  No inguinal lymphadenopathy is
seen.

No acute osseous abnormalities are identified.
IMPRESSION: 1.  Colon largely filled with stool, compatible with mild
constipation.  Mild wall thickening along the rectum may reflect
mild chronic inflammation.
2.  Likely small right renal cyst.
3.  Circumaortic left renal vein noted.

## 2014-04-04 DIAGNOSIS — F319 Bipolar disorder, unspecified: Secondary | ICD-10-CM | POA: Diagnosis not present

## 2014-04-04 DIAGNOSIS — IMO0001 Reserved for inherently not codable concepts without codable children: Secondary | ICD-10-CM | POA: Diagnosis not present

## 2014-04-04 DIAGNOSIS — N952 Postmenopausal atrophic vaginitis: Secondary | ICD-10-CM | POA: Diagnosis not present

## 2014-04-04 DIAGNOSIS — M5430 Sciatica, unspecified side: Secondary | ICD-10-CM | POA: Diagnosis not present

## 2014-04-27 DIAGNOSIS — F17200 Nicotine dependence, unspecified, uncomplicated: Secondary | ICD-10-CM | POA: Diagnosis not present

## 2014-04-27 DIAGNOSIS — F410 Panic disorder [episodic paroxysmal anxiety] without agoraphobia: Secondary | ICD-10-CM | POA: Diagnosis not present

## 2014-04-27 DIAGNOSIS — R0789 Other chest pain: Secondary | ICD-10-CM | POA: Diagnosis not present

## 2014-04-27 DIAGNOSIS — F319 Bipolar disorder, unspecified: Secondary | ICD-10-CM | POA: Diagnosis not present

## 2014-04-27 DIAGNOSIS — R079 Chest pain, unspecified: Secondary | ICD-10-CM | POA: Diagnosis not present

## 2014-06-16 DIAGNOSIS — M5430 Sciatica, unspecified side: Secondary | ICD-10-CM | POA: Diagnosis not present

## 2014-06-16 DIAGNOSIS — M2550 Pain in unspecified joint: Secondary | ICD-10-CM | POA: Diagnosis not present

## 2014-06-16 DIAGNOSIS — D126 Benign neoplasm of colon, unspecified: Secondary | ICD-10-CM | POA: Diagnosis not present

## 2014-08-15 DIAGNOSIS — R202 Paresthesia of skin: Secondary | ICD-10-CM | POA: Diagnosis not present

## 2014-08-15 DIAGNOSIS — F319 Bipolar disorder, unspecified: Secondary | ICD-10-CM | POA: Diagnosis not present

## 2014-08-15 DIAGNOSIS — R209 Unspecified disturbances of skin sensation: Secondary | ICD-10-CM | POA: Diagnosis not present

## 2014-08-15 DIAGNOSIS — R42 Dizziness and giddiness: Secondary | ICD-10-CM | POA: Diagnosis not present

## 2014-08-15 DIAGNOSIS — R51 Headache: Secondary | ICD-10-CM | POA: Diagnosis not present

## 2014-08-15 DIAGNOSIS — R079 Chest pain, unspecified: Secondary | ICD-10-CM | POA: Diagnosis not present

## 2014-08-15 DIAGNOSIS — R531 Weakness: Secondary | ICD-10-CM | POA: Diagnosis not present

## 2014-08-15 DIAGNOSIS — R0789 Other chest pain: Secondary | ICD-10-CM | POA: Diagnosis not present

## 2014-08-15 DIAGNOSIS — M62838 Other muscle spasm: Secondary | ICD-10-CM | POA: Diagnosis not present

## 2014-08-18 DIAGNOSIS — R079 Chest pain, unspecified: Secondary | ICD-10-CM | POA: Diagnosis not present

## 2014-08-19 ENCOUNTER — Encounter (HOSPITAL_COMMUNITY): Payer: Self-pay | Admitting: Emergency Medicine

## 2014-08-19 ENCOUNTER — Emergency Department (HOSPITAL_COMMUNITY)
Admission: EM | Admit: 2014-08-19 | Discharge: 2014-08-19 | Disposition: A | Discharge status: Home / Self Care | Payer: Medicare Other | Attending: Emergency Medicine | Admitting: Emergency Medicine

## 2014-08-19 DIAGNOSIS — M1990 Unspecified osteoarthritis, unspecified site: Secondary | ICD-10-CM | POA: Insufficient documentation

## 2014-08-19 DIAGNOSIS — M7071 Other bursitis of hip, right hip: Secondary | ICD-10-CM

## 2014-08-19 DIAGNOSIS — Z79899 Other long term (current) drug therapy: Secondary | ICD-10-CM | POA: Insufficient documentation

## 2014-08-19 DIAGNOSIS — R079 Chest pain, unspecified: Secondary | ICD-10-CM | POA: Diagnosis not present

## 2014-08-19 DIAGNOSIS — F319 Bipolar disorder, unspecified: Secondary | ICD-10-CM | POA: Insufficient documentation

## 2014-08-19 DIAGNOSIS — Z7952 Long term (current) use of systemic steroids: Secondary | ICD-10-CM | POA: Diagnosis not present

## 2014-08-19 DIAGNOSIS — M797 Fibromyalgia: Secondary | ICD-10-CM | POA: Insufficient documentation

## 2014-08-19 DIAGNOSIS — M25511 Pain in right shoulder: Secondary | ICD-10-CM | POA: Insufficient documentation

## 2014-08-19 DIAGNOSIS — F419 Anxiety disorder, unspecified: Secondary | ICD-10-CM | POA: Insufficient documentation

## 2014-08-19 DIAGNOSIS — M25551 Pain in right hip: Secondary | ICD-10-CM | POA: Diagnosis present

## 2014-08-19 DIAGNOSIS — Y939 Activity, unspecified: Secondary | ICD-10-CM | POA: Diagnosis not present

## 2014-08-19 DIAGNOSIS — Z87891 Personal history of nicotine dependence: Secondary | ICD-10-CM | POA: Diagnosis not present

## 2014-08-19 DIAGNOSIS — M71551 Other bursitis, not elsewhere classified, right hip: Secondary | ICD-10-CM | POA: Diagnosis not present

## 2014-08-19 MED ORDER — MORPHINE SULFATE 4 MG/ML IJ SOLN
4.0000 mg | Freq: Once | INTRAMUSCULAR | Status: AC
  Administered 2014-08-19: 4 mg via INTRAMUSCULAR
  Filled 2014-08-19: qty 1

## 2014-08-19 MED ORDER — OXYCODONE-ACETAMINOPHEN 5-325 MG PO TABS: 1.0000 | ORAL_TABLET | Freq: Four times a day (QID) | ORAL | Status: AC | PRN

## 2014-08-19 NOTE — Discharge Instructions (Signed)
Call for a follow up appointment with a Family or Primary Care Provider.  Call a orthopedic for further evaluation of your right shoulder and right hip pain. Return if Symptoms worsen.   Take medication as prescribed.  Ice her shoulder and hip 3-4 times a day.

## 2014-08-19 NOTE — ED Notes (Signed)
Pt reports right hip pain with intermittent shooting pain down right leg since this AM. Pt also reports pain to right shoulder with shooting pain down right arm. States "it feels like my nerves are going crazy." Pain worse with movement. Denies numbness/tingling. Denies injury. Pt ambulatory, but is using cane. Denies any CP or SOB. Pt reports taking Vicodin and Tramadol with no relief. NAD.

## 2014-08-19 NOTE — ED Provider Notes (Signed)
CSN: [id number]     Arrival date & time 08/19/14  1524 History   First MD Initiated Contact with Patient 08/19/14 1930     Chief Complaint  Patient presents with  . Hip Pain  . Arm Pain     (Consider location/radiation/quality/duration/timing/severity/associated sxs/prior Treatment) HPI Comments: The patient is a 48 year old female with a history of fibromyalgia, bipolar, anxiety presenting to the emergency Department chief complaint of right shoulder pain and right hip pain since this morning. Patient denies injury. Patient reports right hip pain, worse on with direct pressure and movement, She reports increasing pain with ambulation. She also reports right shoulder pain, sharp worsened with palpation and certain movements. Patient denies neck pain. She denies numbness paresthesias, weakness.  States "slept on it wrong". PCP: Cyndi Bender.  The history is provided by the patient. No language interpreter was used.    Past Medical History  Diagnosis Date  . Bipolar 1 disorder   . Fibromyalgia   . Arthritis   . Anxiety    Past Surgical History  Procedure Laterality Date  . Abdominal hysterectomy     No family history on file. History  Substance Use Topics  . Smoking status: Former Smoker    Quit date: 12/18/2009  . Smokeless tobacco: Not on file  . Alcohol Use: 0.0 oz/week    3-7 Cans of beer per week   OB History   Grav Para Term Preterm Abortions TAB SAB Ect Mult Living                 Review of Systems  Constitutional: Negative for fever and chills.  Musculoskeletal: Positive for arthralgias and myalgias. Negative for joint swelling.  Skin: Negative for color change, rash and wound.  Neurological: Negative for weakness and numbness.      Allergies  Review of patient's allergies indicates no known allergies.  Home Medications   Prior to Admission medications   Medication Sig Start Date End Date Taking? Authorizing Provider  amphetamine-dextroamphetamine  (ADDERALL) 10 MG tablet Take 10 mg by mouth 3 (three) times daily.   Yes Historical Provider, MD  buPROPion (WELLBUTRIN SR) 200 MG 12 hr tablet Take 200 mg by mouth 2 (two) times daily.    Yes Historical Provider, MD  clonazePAM (KLONOPIN) 1 MG tablet Take 1 mg by mouth 4 (four) times daily.   Yes Historical Provider, MD  cyclobenzaprine (FLEXERIL) 10 MG tablet Take 20 mg by mouth at bedtime. Muscle spasm.   Yes Historical Provider, MD  docusate sodium (COLACE) 100 MG capsule Take 100 mg by mouth 2 (two) times daily.   Yes Historical Provider, MD  esomeprazole (NEXIUM) 40 MG capsule Take 40 mg by mouth daily at 12 noon.   Yes Historical Provider, MD  furosemide (LASIX) 20 MG tablet Take 20 mg by mouth at bedtime.   Yes Historical Provider, MD  gabapentin (NEURONTIN) 300 MG capsule Take 300 mg by mouth 2 (two) times daily.   Yes Historical Provider, MD  meloxicam (MOBIC) 15 MG tablet Take 15 mg by mouth daily.   Yes Historical Provider, MD  topiramate (TOPAMAX) 200 MG tablet Take 600 mg by mouth at bedtime.   Yes Historical Provider, MD  traMADol (ULTRAM) 50 MG tablet Take 50 mg by mouth every 6 (six) hours as needed for pain. Maximum dose= 8 tablets per day. pain   Yes Historical Provider, MD  ziprasidone (GEODON) 60 MG capsule Take 60 mg by mouth at bedtime.   Yes Historical Provider, MD  BP 101/67  Pulse 95  Temp(Src) 97.3 F (36.3 C) (Oral)  Resp 18  SpO2 100% Physical Exam  Nursing note and vitals reviewed. Constitutional: She appears well-developed and well-nourished. No distress.  HENT:  Head: Normocephalic and atraumatic.  Right Ear: Tympanic membrane normal. Tympanic membrane is not erythematous and not bulging.  Left Ear: Tympanic membrane normal. Tympanic membrane is not erythematous and not bulging.  Cardiovascular: Normal rate and regular rhythm.   Pulses:      Radial pulses are 2+ on the right side, and 3+ on the left side.       Dorsalis pedis pulses are 2+ on the right  side, and 2+ on the left side.  Musculoskeletal: Normal range of motion.       Right shoulder: She exhibits tenderness. She exhibits normal range of motion and no crepitus.       Arms: Right Hip: Tenderness to palpation over greater trochanter, no obvious deformity, no increase in warmth to touch, no crepitus no deformity. Sensation and strength equal bilaterally. Right shoulder: Mild tenderness to palpation of posterior shoulder, no crepitus no obvious deformity.  Skin: Skin is warm and dry.  Psychiatric: She has a normal mood and affect.    ED Course  Procedures (including critical care time) Labs Review Labs Reviewed - No data to display  Imaging Review No results found.   EKG Interpretation None      MDM   Final diagnoses:  Hip bursitis, right  Right shoulder pain   Patient presents with nontraumatic right shoulder and right hip pain and normal sensation and strength, no obvious deformity sign of infected joint. Right hip likely hip bursitis, right shoulder likely MSK. Patient is compliant with low back advised patient to continue most came for anti-inflammatory, ice, Percocet given, orthopedist follow-up. Discussed treatment plan with the patient. Return precautions given. Reports understanding and no other concerns at this time.  Patient is stable for discharge at this time. Meds given in ED:  Medications  morphine 4 MG/ML injection 4 mg (4 mg Intramuscular Given 08/19/14 2021)    Discharge Medication List as of 08/19/2014  8:25 PM    START taking these medications   Details  oxyCODONE-acetaminophen (PERCOCET/ROXICET) 5-325 MG per tablet Take 1 tablet by mouth every 6 (six) hours as needed for severe pain., Starting 08/19/2014, Until Discontinued, Print           Harvie Heck, PA-C 08/20/14 [id number]

## 2014-08-20 NOTE — ED Provider Notes (Signed)
Medical screening examination/treatment/procedure(s) were performed by non-physician practitioner and as supervising physician I was immediately available for consultation/collaboration.    Dorie Rank, MD 08/20/14 1537

## 2014-08-21 DIAGNOSIS — M25511 Pain in right shoulder: Secondary | ICD-10-CM | POA: Diagnosis not present

## 2014-08-21 DIAGNOSIS — M25551 Pain in right hip: Secondary | ICD-10-CM | POA: Diagnosis not present

## 2014-08-21 DIAGNOSIS — M797 Fibromyalgia: Secondary | ICD-10-CM | POA: Diagnosis not present

## 2014-08-28 DIAGNOSIS — R209 Unspecified disturbances of skin sensation: Secondary | ICD-10-CM | POA: Diagnosis not present

## 2014-08-28 DIAGNOSIS — Z87891 Personal history of nicotine dependence: Secondary | ICD-10-CM | POA: Diagnosis not present

## 2014-08-28 DIAGNOSIS — R202 Paresthesia of skin: Secondary | ICD-10-CM | POA: Diagnosis not present

## 2014-08-29 ENCOUNTER — Encounter: Payer: Self-pay | Admitting: Physical Medicine & Rehabilitation

## 2014-09-11 DIAGNOSIS — M5442 Lumbago with sciatica, left side: Secondary | ICD-10-CM | POA: Diagnosis not present

## 2014-09-11 DIAGNOSIS — M5440 Lumbago with sciatica, unspecified side: Secondary | ICD-10-CM | POA: Diagnosis not present

## 2014-09-11 DIAGNOSIS — M25511 Pain in right shoulder: Secondary | ICD-10-CM | POA: Diagnosis not present

## 2014-09-11 DIAGNOSIS — M25512 Pain in left shoulder: Secondary | ICD-10-CM | POA: Diagnosis not present

## 2014-09-11 DIAGNOSIS — E559 Vitamin D deficiency, unspecified: Secondary | ICD-10-CM | POA: Diagnosis not present

## 2014-09-11 DIAGNOSIS — M2550 Pain in unspecified joint: Secondary | ICD-10-CM | POA: Diagnosis not present

## 2014-09-11 DIAGNOSIS — M5441 Lumbago with sciatica, right side: Secondary | ICD-10-CM | POA: Diagnosis not present

## 2014-09-16 ENCOUNTER — Other Ambulatory Visit: Payer: Self-pay | Admitting: Physical Medicine & Rehabilitation

## 2014-09-16 ENCOUNTER — Ambulatory Visit (HOSPITAL_BASED_OUTPATIENT_CLINIC_OR_DEPARTMENT_OTHER): Payer: Medicare Other | Admitting: Physical Medicine & Rehabilitation

## 2014-09-16 ENCOUNTER — Encounter: Payer: Self-pay | Admitting: Physical Medicine & Rehabilitation

## 2014-09-16 ENCOUNTER — Encounter: Payer: Medicare Other | Attending: Physical Medicine & Rehabilitation

## 2014-09-16 ENCOUNTER — Other Ambulatory Visit: Payer: Self-pay | Admitting: Orthopedic Surgery

## 2014-09-16 VITALS — BP 128/72 | HR 82 | Resp 14 | Ht 63.0 in | Wt 124.0 lb

## 2014-09-16 DIAGNOSIS — M25552 Pain in left hip: Secondary | ICD-10-CM | POA: Diagnosis not present

## 2014-09-16 DIAGNOSIS — M542 Cervicalgia: Secondary | ICD-10-CM | POA: Insufficient documentation

## 2014-09-16 DIAGNOSIS — M25551 Pain in right hip: Secondary | ICD-10-CM | POA: Insufficient documentation

## 2014-09-16 DIAGNOSIS — Z5181 Encounter for therapeutic drug level monitoring: Secondary | ICD-10-CM

## 2014-09-16 DIAGNOSIS — M79605 Pain in left leg: Secondary | ICD-10-CM | POA: Insufficient documentation

## 2014-09-16 DIAGNOSIS — G894 Chronic pain syndrome: Secondary | ICD-10-CM

## 2014-09-16 DIAGNOSIS — Z79899 Other long term (current) drug therapy: Secondary | ICD-10-CM | POA: Diagnosis not present

## 2014-09-16 DIAGNOSIS — M797 Fibromyalgia: Secondary | ICD-10-CM | POA: Diagnosis not present

## 2014-09-16 DIAGNOSIS — M79604 Pain in right leg: Secondary | ICD-10-CM | POA: Diagnosis not present

## 2014-09-16 DIAGNOSIS — M545 Low back pain: Secondary | ICD-10-CM | POA: Diagnosis not present

## 2014-09-16 MED ORDER — TRAMADOL HCL 50 MG PO TABS: 50.0000 mg | ORAL_TABLET | Freq: Three times a day (TID) | ORAL | Status: AC

## 2014-09-16 MED ORDER — GABAPENTIN 600 MG PO TABS: 600.0000 mg | ORAL_TABLET | Freq: Three times a day (TID) | ORAL | Status: AC

## 2014-09-16 NOTE — Progress Notes (Signed)
Subjective:    Patient ID: Rita Bennett, female    DOB: 1966/09/29, 48 y.o.   MRN: [id number] CC:  Lower back that goes into legs for >81yrs HPI Ortho eval last week, Dr Marlou Sa, multiple Xrays Bilat shoulder, hip, neckAs well as back. She was told that her x-rays were all okay except the neck x-rays were not reviewed.  Some pain in hips since childhood  Neck pain which worsens with tension Takes care of husband who had a stroke a couple years ago, doesn't require dressing or bathing assist  So has pain below the bra line on both sides.  Has pain in the hands between the thumb and forefinger bilateral  Walking tolerance 15-30 minutes Able to climb steps able to drive independently with dressing and bathing needs some assistance with meal prep and household duties and shopping.  Past surgical history: Partial hysterectomy, repair of rectocele, bladder sling, prolapse uterus repair.  Pain Inventory Average Pain 5 Pain Right Now 7 My pain is constant, sharp, stabbing and aching  In the last 24 hours, has pain interfered with the following? General activity 7 Relation with others 7 Enjoyment of life 8 What TIME of day is your pain at its worst? daytime Sleep (in general) Fair  Pain is worse with: walking and standing Pain improves with: heat/ice, medication and injections Relief from Meds: 7  Mobility walk without assistance how many minutes can you walk? 15-30 ability to climb steps?  yes do you drive?  yes  Function disabled: date disabled .  Neuro/Psych numbness tremor trouble walking spasms dizziness depression anxiety  Prior Studies Any changes since last visit?  no  Physicians involved in your care Any changes since last visit?  no   Family History  Problem Relation Age of Onset  . Heart disease Father   . Depression Brother    History   Social History  . Marital Status: Married    Spouse Name: N/A    Number of Children: N/A  . Years of  Education: N/A   Social History Main Topics  . Smoking status: Former Smoker    Quit date: 12/18/2009  . Smokeless tobacco: None  . Alcohol Use: 0.0 oz/week    3-7 Cans of beer per week  . Drug Use: Yes    Special: Cocaine, Marijuana  . Sexual Activity: Yes    Birth Control/ Protection: Surgical   Other Topics Concern  . None   Social History Narrative   Past Surgical History  Procedure Laterality Date  . Abdominal hysterectomy     Past Medical History  Diagnosis Date  . Bipolar 1 disorder   . Fibromyalgia   . Arthritis   . Anxiety    BP 128/72 mmHg  Pulse 82  Resp 14  Ht 5\' 3"  (1.6 m)  Wt 124 lb (56.246 kg)  BMI 21.97 kg/m2  SpO2 98%  Opioid Risk Score:   Fall Risk Score:    Review of Systems  Constitutional: Negative.   HENT: Negative.   Eyes: Negative.   Respiratory: Negative.   Cardiovascular: Negative.   Gastrointestinal: Negative.   Endocrine: Negative.   Genitourinary: Negative.   Musculoskeletal: Positive for myalgias, back pain, arthralgias and neck pain.  Skin: Negative.   Allergic/Immunologic: Negative.   Neurological: Positive for dizziness, tremors and numbness.       Trouble walking, spasms  Hematological: Negative.   Psychiatric/Behavioral: Positive for dysphoric mood. The patient is nervous/anxious.        Objective:  Physical Exam  Constitutional: She is oriented to person, place, and time. She appears well-developed and well-nourished.  HENT:  Head: Normocephalic and atraumatic.  Eyes: Conjunctivae and EOM are normal. Pupils are equal, round, and reactive to light.  Neurological: She is alert and oriented to person, place, and time. She has normal strength. Coordination and gait normal.  Reflex Scores:      Tricep reflexes are 2+ on the right side and 2+ on the left side.      Bicep reflexes are 2+ on the right side and 2+ on the left side.      Brachioradialis reflexes are 2+ on the right side and 2+ on the left side.       Patellar reflexes are 2+ on the right side and 2+ on the left side.      Achilles reflexes are 2+ on the right side and 2+ on the left side. Negative straight leg raise Sensation intact pinprick bilateral upper and lower limbs  Motor strength 5/5 bilateralDeltoid, biceps, triceps, grip, hip flexor, knee extensor, ankle dorsiflexor and plantar flexor  Psychiatric: She has a normal mood and affect.  Nursing note and vitals reviewed.  Range of motion is full And pain-free bilateral upper and lower extremities Lumbar range of motion 50% flexion and extension and lateral rotation and bending, Pain-free Cervical range of motion is full And pain-free        Assessment & Plan:  1. Fibromyalgia syndrome-Patient without any signs of inflammatory arthritis joint limitations or neurological exam abnormalities. Has had this diagnosis for number of years. We discussed treatment options Mainstay of treatment is exercise gentle aerobic, aquatic exercise recommended she states that she is financially unable to do this. We then discussed walking 30 minutes a day which can be broken down to 3x10 minutes segments.  Discussed increasing dose of Neurontin to 600 mg twice a day for a month and then to 3 times a day thereafter. This is the usual effective dose for fibromyalgia syndrome.  We also started low-dose tramadol 50 mg 3 times a day would not go to 100 mg dose secondary to other psychiatric medications  Elevated opioid risk score of 9, non-narcotic treatment program discussed. Patient is willing to try above treatment regimen but states that if it doesn't work she'll have to use a stronger pain medicines.

## 2014-09-16 NOTE — Patient Instructions (Addendum)
Aquatic therapy recommended  Walking 36min 3 times a day  Non narcotic treatment program secondary to elevated opioid risk score

## 2014-09-17 LAB — PMP ALCOHOL METABOLITE (ETG): Ethyl Glucuronide (EtG): NEGATIVE ng/mL

## 2014-09-21 LAB — BENZODIAZEPINES (GC/LC/MS), URINE
ALPRAZOLAMU: NEGATIVE ng/mL (ref <25)
Clonazepam metabolite (GC/LC/MS), ur confirm: 134 ng/mL — AB (ref <25)
Flurazepam metabolite (GC/LC/MS), ur confirm: NEGATIVE ng/mL (ref <50)
LORAZEPAMU: NEGATIVE ng/mL (ref <50)
Midazolam (GC/LC/MS), ur confirm: NEGATIVE ng/mL (ref <50)
Nordiazepam (GC/LC/MS), ur confirm: NEGATIVE ng/mL (ref <50)
Oxazepam (GC/LC/MS), ur confirm: NEGATIVE ng/mL (ref <50)
Temazepam (GC/LC/MS), ur confirm: NEGATIVE ng/mL (ref <50)
Triazolam metabolite (GC/LC/MS), ur confirm: NEGATIVE ng/mL (ref <50)

## 2014-09-21 LAB — AMPHETAMINES (GC/LC/MS), URINE
Amphetamine GC/MS Conf: 1562 ng/mL — AB (ref <250)
Methamphetamine Quant, Ur: NEGATIVE ng/mL (ref <250)

## 2014-09-21 LAB — TRAMADOL, URINE
N-DESMETHYL-CIS-TRAMADOL: 812 ng/mL — AB (ref <100)
TRAMADOL, URINE: 7806 ng/mL — AB (ref <100)

## 2014-09-23 LAB — PRESCRIPTION MONITORING PROFILE (SOLSTAS)
Barbiturate Screen, Urine: NEGATIVE ng/mL
Buprenorphine, Urine: NEGATIVE ng/mL
COCAINE METABOLITES: NEGATIVE ng/mL
Cannabinoid Scrn, Ur: NEGATIVE ng/mL
Carisoprodol, Urine: NEGATIVE ng/mL
Creatinine, Urine: 37.19 mg/dL (ref >20.0)
ECSTASY: NEGATIVE ng/mL
Fentanyl, Ur: NEGATIVE ng/mL
METHADONE SCREEN, URINE: NEGATIVE ng/mL
Meperidine, Ur: NEGATIVE ng/mL
Nitrites, Initial: NEGATIVE ug/mL
OPIATE SCREEN, URINE: NEGATIVE ng/mL
Oxycodone Screen, Ur: NEGATIVE ng/mL
PH URINE, INITIAL: 6.8 pH (ref 4.5–8.9)
Propoxyphene: NEGATIVE ng/mL
Tapentadol, urine: NEGATIVE ng/mL
Zolpidem, Urine: NEGATIVE ng/mL

## 2014-09-25 DIAGNOSIS — N63 Unspecified lump in breast: Secondary | ICD-10-CM | POA: Diagnosis not present

## 2014-09-25 DIAGNOSIS — M2550 Pain in unspecified joint: Secondary | ICD-10-CM | POA: Diagnosis not present

## 2014-09-25 DIAGNOSIS — M797 Fibromyalgia: Secondary | ICD-10-CM | POA: Diagnosis not present

## 2014-09-30 ENCOUNTER — Other Ambulatory Visit: Payer: Self-pay

## 2014-10-01 ENCOUNTER — Ambulatory Visit
Admission: RE | Admit: 2014-10-01 | Discharge: 2014-10-01 | Disposition: A | Discharge status: Home / Self Care | Payer: Medicare Other | Source: Ambulatory Visit | Attending: Orthopedic Surgery | Admitting: Orthopedic Surgery

## 2014-10-01 DIAGNOSIS — M5022 Other cervical disc displacement, mid-cervical region: Secondary | ICD-10-CM | POA: Diagnosis not present

## 2014-10-01 DIAGNOSIS — M542 Cervicalgia: Secondary | ICD-10-CM

## 2014-10-02 DIAGNOSIS — M542 Cervicalgia: Secondary | ICD-10-CM | POA: Diagnosis not present

## 2014-10-06 DIAGNOSIS — D1739 Benign lipomatous neoplasm of skin and subcutaneous tissue of other sites: Secondary | ICD-10-CM | POA: Diagnosis not present

## 2014-10-06 DIAGNOSIS — N63 Unspecified lump in breast: Secondary | ICD-10-CM | POA: Diagnosis not present

## 2014-10-08 NOTE — Progress Notes (Signed)
Inconsistent. Negative for oxycodone- last reported taken day of test.

## 2014-10-20 DIAGNOSIS — Z87891 Personal history of nicotine dependence: Secondary | ICD-10-CM | POA: Diagnosis not present

## 2014-10-20 DIAGNOSIS — R51 Headache: Secondary | ICD-10-CM | POA: Diagnosis not present

## 2014-11-19 DIAGNOSIS — M2550 Pain in unspecified joint: Secondary | ICD-10-CM | POA: Diagnosis not present

## 2014-11-19 DIAGNOSIS — E559 Vitamin D deficiency, unspecified: Secondary | ICD-10-CM | POA: Diagnosis not present

## 2014-11-19 DIAGNOSIS — N951 Menopausal and female climacteric states: Secondary | ICD-10-CM | POA: Diagnosis not present

## 2014-11-19 DIAGNOSIS — K219 Gastro-esophageal reflux disease without esophagitis: Secondary | ICD-10-CM | POA: Diagnosis not present

## 2014-11-19 DIAGNOSIS — M797 Fibromyalgia: Secondary | ICD-10-CM | POA: Diagnosis not present

## 2014-11-19 DIAGNOSIS — Z79899 Other long term (current) drug therapy: Secondary | ICD-10-CM | POA: Diagnosis not present

## 2014-12-01 DIAGNOSIS — M79602 Pain in left arm: Secondary | ICD-10-CM | POA: Diagnosis not present

## 2014-12-01 DIAGNOSIS — G894 Chronic pain syndrome: Secondary | ICD-10-CM | POA: Diagnosis not present

## 2014-12-01 DIAGNOSIS — M542 Cervicalgia: Secondary | ICD-10-CM | POA: Diagnosis not present

## 2014-12-01 DIAGNOSIS — M545 Low back pain: Secondary | ICD-10-CM | POA: Diagnosis not present

## 2014-12-19 DIAGNOSIS — M25512 Pain in left shoulder: Secondary | ICD-10-CM | POA: Diagnosis not present

## 2014-12-19 DIAGNOSIS — M67814 Other specified disorders of tendon, left shoulder: Secondary | ICD-10-CM | POA: Diagnosis not present

## 2014-12-19 DIAGNOSIS — M79602 Pain in left arm: Secondary | ICD-10-CM | POA: Diagnosis not present

## 2014-12-19 DIAGNOSIS — M5010 Cervical disc disorder with radiculopathy, unspecified cervical region: Secondary | ICD-10-CM | POA: Diagnosis not present

## 2014-12-22 DIAGNOSIS — G894 Chronic pain syndrome: Secondary | ICD-10-CM | POA: Diagnosis not present

## 2014-12-22 DIAGNOSIS — M545 Low back pain: Secondary | ICD-10-CM | POA: Diagnosis not present

## 2014-12-22 DIAGNOSIS — M79602 Pain in left arm: Secondary | ICD-10-CM | POA: Diagnosis not present

## 2014-12-22 DIAGNOSIS — M542 Cervicalgia: Secondary | ICD-10-CM | POA: Diagnosis not present

## 2015-01-08 DIAGNOSIS — M5010 Cervical disc disorder with radiculopathy, unspecified cervical region: Secondary | ICD-10-CM | POA: Diagnosis not present

## 2015-01-10 DIAGNOSIS — M792 Neuralgia and neuritis, unspecified: Secondary | ICD-10-CM | POA: Diagnosis not present

## 2015-01-10 DIAGNOSIS — Z87891 Personal history of nicotine dependence: Secondary | ICD-10-CM | POA: Diagnosis not present

## 2015-01-21 DIAGNOSIS — Z87891 Personal history of nicotine dependence: Secondary | ICD-10-CM | POA: Diagnosis not present

## 2015-01-21 DIAGNOSIS — G43909 Migraine, unspecified, not intractable, without status migrainosus: Secondary | ICD-10-CM | POA: Diagnosis not present

## 2015-02-26 DIAGNOSIS — M5010 Cervical disc disorder with radiculopathy, unspecified cervical region: Secondary | ICD-10-CM | POA: Diagnosis not present

## 2015-02-26 DIAGNOSIS — M5442 Lumbago with sciatica, left side: Secondary | ICD-10-CM | POA: Diagnosis not present

## 2015-02-26 DIAGNOSIS — M5441 Lumbago with sciatica, right side: Secondary | ICD-10-CM | POA: Diagnosis not present

## 2015-02-27 ENCOUNTER — Other Ambulatory Visit: Payer: Self-pay | Admitting: Orthopedic Surgery

## 2015-02-27 DIAGNOSIS — M545 Low back pain: Secondary | ICD-10-CM

## 2015-03-13 DIAGNOSIS — M5010 Cervical disc disorder with radiculopathy, unspecified cervical region: Secondary | ICD-10-CM | POA: Diagnosis not present

## 2015-03-13 DIAGNOSIS — M5115 Intervertebral disc disorders with radiculopathy, thoracolumbar region: Secondary | ICD-10-CM | POA: Diagnosis not present

## 2015-03-19 DIAGNOSIS — Z6823 Body mass index (BMI) 23.0-23.9, adult: Secondary | ICD-10-CM | POA: Diagnosis not present

## 2015-03-19 DIAGNOSIS — Z23 Encounter for immunization: Secondary | ICD-10-CM | POA: Diagnosis not present

## 2015-03-19 DIAGNOSIS — S61219A Laceration without foreign body of unspecified finger without damage to nail, initial encounter: Secondary | ICD-10-CM | POA: Diagnosis not present

## 2015-03-19 DIAGNOSIS — R110 Nausea: Secondary | ICD-10-CM | POA: Diagnosis not present

## 2015-03-24 ENCOUNTER — Other Ambulatory Visit: Payer: Self-pay

## 2015-03-26 ENCOUNTER — Emergency Department (HOSPITAL_COMMUNITY)
Admission: EM | Admit: 2015-03-26 | Discharge: 2015-03-26 | Disposition: A | Discharge status: Home / Self Care | Payer: Medicare Other | Attending: Emergency Medicine | Admitting: Emergency Medicine

## 2015-03-26 DIAGNOSIS — F319 Bipolar disorder, unspecified: Secondary | ICD-10-CM | POA: Insufficient documentation

## 2015-03-26 DIAGNOSIS — M545 Low back pain: Secondary | ICD-10-CM | POA: Diagnosis not present

## 2015-03-26 DIAGNOSIS — Z79899 Other long term (current) drug therapy: Secondary | ICD-10-CM | POA: Diagnosis not present

## 2015-03-26 DIAGNOSIS — M7989 Other specified soft tissue disorders: Secondary | ICD-10-CM | POA: Diagnosis present

## 2015-03-26 DIAGNOSIS — M797 Fibromyalgia: Secondary | ICD-10-CM | POA: Insufficient documentation

## 2015-03-26 DIAGNOSIS — F419 Anxiety disorder, unspecified: Secondary | ICD-10-CM | POA: Insufficient documentation

## 2015-03-26 DIAGNOSIS — R609 Edema, unspecified: Secondary | ICD-10-CM

## 2015-03-26 DIAGNOSIS — M1990 Unspecified osteoarthritis, unspecified site: Secondary | ICD-10-CM | POA: Diagnosis not present

## 2015-03-26 DIAGNOSIS — R600 Localized edema: Secondary | ICD-10-CM | POA: Insufficient documentation

## 2015-03-26 DIAGNOSIS — Z87891 Personal history of nicotine dependence: Secondary | ICD-10-CM | POA: Insufficient documentation

## 2015-03-26 LAB — I-STAT CHEM 8, ED
BUN: 11 mg/dL (ref 6–20)
Calcium, Ion: 1.28 mmol/L — ABNORMAL HIGH (ref 1.12–1.23)
Chloride: 108 mmol/L (ref 101–111)
Creatinine, Ser: 0.7 mg/dL (ref 0.44–1.00)
Glucose, Bld: 93 mg/dL (ref 65–99)
HCT: 36 % (ref 36.0–46.0)
Hemoglobin: 12.2 g/dL (ref 12.0–15.0)
Potassium: 4.1 mmol/L (ref 3.5–5.1)
Sodium: 142 mmol/L (ref 135–145)
TCO2: 20 mmol/L (ref 0–100)

## 2015-03-26 LAB — URINALYSIS, ROUTINE W REFLEX MICROSCOPIC
Bilirubin Urine: NEGATIVE
Glucose, UA: NEGATIVE mg/dL
Hgb urine dipstick: NEGATIVE
Ketones, ur: NEGATIVE mg/dL
Leukocytes, UA: NEGATIVE
Nitrite: NEGATIVE
Protein, ur: NEGATIVE mg/dL
Specific Gravity, Urine: 1.004 — ABNORMAL LOW (ref 1.005–1.030)
Urobilinogen, UA: 0.2 mg/dL (ref 0.0–1.0)
pH: 7.5 (ref 5.0–8.0)

## 2015-03-26 MED ORDER — FUROSEMIDE 20 MG PO TABS
20.0000 mg | ORAL_TABLET | Freq: Two times a day (BID) | ORAL | Status: AC
Start: 2015-03-26 — End: ?

## 2015-03-26 MED ORDER — TRAMADOL HCL 50 MG PO TABS: 50.0000 mg | ORAL_TABLET | Freq: Four times a day (QID) | ORAL | Status: AC | PRN

## 2015-03-26 MED ORDER — OXYCODONE-ACETAMINOPHEN 5-325 MG PO TABS
1.0000 | ORAL_TABLET | Freq: Once | ORAL | Status: AC
Start: 2015-03-26 — End: 2015-03-26
  Administered 2015-03-26: 1 via ORAL
  Filled 2015-03-26: qty 1

## 2015-03-26 MED ORDER — LORAZEPAM 1 MG PO TABS
1.0000 mg | ORAL_TABLET | Freq: Once | ORAL | Status: AC
  Administered 2015-03-26: 1 mg via ORAL
  Filled 2015-03-26: qty 1

## 2015-03-26 MED ORDER — IBUPROFEN 200 MG PO TABS
600.0000 mg | ORAL_TABLET | Freq: Once | ORAL | Status: AC
  Administered 2015-03-26: 600 mg via ORAL
  Filled 2015-03-26: qty 3

## 2015-03-26 NOTE — ED Notes (Signed)
Pt c/o left leg edema and pain onset 2 days ago. Pedal pulses 2 +, sensation and motor function intact. Pt states she had one similar episode, which was attributed to constipation.

## 2015-03-26 NOTE — ED Notes (Signed)
Pt's son, Eilene Ghazi, called stating that pt's husband, Marya Amsler, "is either lying to me or loosing his mind."  Pt was left at doctor's apt yesterday and walked to [place] and son wasn't made aware until after 6pm when he got off work that she needed a ride. Son requesting that pt doesn't leave with her husband. Pt has son's phone number and he is to be called when pt is up for discharge so he can come pick her up and take her to his house.

## 2015-03-26 NOTE — Discharge Instructions (Signed)
Back Pain, Adult Back pain is very common. The pain often gets better over time. The cause of back pain is usually not dangerous. Most people can learn to manage their back pain on their own.  HOME CARE   Stay active. Start with short walks on flat ground if you can. Try to walk farther each day.  Do not sit, drive, or stand in one place for more than 30 minutes. Do not stay in bed.  Do not avoid exercise or work. Activity can help your back heal faster.  Be careful when you bend or lift an object. Bend at your knees, keep the object close to you, and do not twist.  Sleep on a firm mattress. Lie on your side, and bend your knees. If you lie on your back, put a pillow under your knees.  Only take medicines as told by your doctor.  Put ice on the injured area.  Put ice in a plastic bag.  Place a towel between your skin and the bag.  Leave the ice on for 15-20 minutes, 03-04 times a day for the first 2 to 3 days. After that, you can switch between ice and heat packs.  Ask your doctor about back exercises or massage.  Avoid feeling anxious or stressed. Find good ways to deal with stress, such as exercise. GET HELP RIGHT AWAY IF:   Your pain does not go away with rest or medicine.  Your pain does not go away in 1 week.  You have new problems.  You do not feel well.  The pain spreads into your legs.  You cannot control when you poop (bowel movement) or pee (urinate).  Your arms or legs feel weak or lose feeling (numbness).  You feel sick to your stomach (nauseous) or throw up (vomit).  You have belly (abdominal) pain.  You feel like you may pass out (faint). MAKE SURE YOU:   Understand these instructions.  Will watch your condition.  Will get help right away if you are not doing well or get worse. Document Released: 03/28/2008 Document Revised: 01/02/2012 Document Reviewed: 02/11/2014 [facility] Patient Information 2015 [place], [place]. This information is not intended  to replace advice given to you by your health care provider. Make sure you discuss any questions you have with your health care provider.  Peripheral Edema You have swelling in your legs (peripheral edema). This swelling is due to excess accumulation of salt and water in your body. Edema may be a sign of heart, kidney or liver disease, or a side effect of a medication. It may also be due to problems in the leg veins. Elevating your legs and using special support stockings may be very helpful, if the cause of the swelling is due to poor venous circulation. Avoid long periods of standing, whatever the cause. Treatment of edema depends on identifying the cause. Chips, pretzels, pickles and other salty foods should be avoided. Restricting salt in your diet is almost always needed. Water pills (diuretics) are often used to remove the excess salt and water from your body via urine. These medicines prevent the kidney from reabsorbing sodium. This increases urine flow. Diuretic treatment may also result in lowering of potassium levels in your body. Potassium supplements may be needed if you have to use diuretics daily. Daily weights can help you keep track of your progress in clearing your edema. You should call your caregiver for follow up care as recommended. SEEK IMMEDIATE MEDICAL CARE IF:   You have  increased swelling, pain, redness, or heat in your legs.  You develop shortness of breath, especially when lying down.  You develop chest or abdominal pain, weakness, or fainting.  You have a fever. Document Released: 11/17/2004 Document Revised: 01/02/2012 Document Reviewed: 10/28/2009 [facility] Patient Information 2015 [place], [place]. This information is not intended to replace advice given to you by your health care provider. Make sure you discuss any questions you have with your health care provider.

## 2015-04-04 DIAGNOSIS — M549 Dorsalgia, unspecified: Secondary | ICD-10-CM | POA: Diagnosis not present

## 2015-04-04 DIAGNOSIS — T6591XA Toxic effect of unspecified substance, accidental (unintentional), initial encounter: Secondary | ICD-10-CM | POA: Diagnosis not present

## 2015-04-04 DIAGNOSIS — T50901A Poisoning by unspecified drugs, medicaments and biological substances, accidental (unintentional), initial encounter: Secondary | ICD-10-CM | POA: Diagnosis not present

## 2015-04-04 NOTE — ED Provider Notes (Signed)
CSN: [id number]     Arrival date & time 03/26/15  1318 History   First MD Initiated Contact with Patient 03/26/15 1344     Chief Complaint  Patient presents with  . Leg Swelling     (Consider location/radiation/quality/duration/timing/severity/associated sxs/prior Treatment) HPI   49 year old female with lower extremity edema. She feels like left leg is worse than right. Worsening over the past couple days. Denies significant leg pain. No rash. No fevers or chills. No respiratory complaints. Is complaining of lower back pain, but this is not acute. This is an issue she's bending with for quite some time.  Past Medical History  Diagnosis Date  . Bipolar 1 disorder   . Fibromyalgia   . Arthritis   . Anxiety    Past Surgical History  Procedure Laterality Date  . Abdominal hysterectomy     Family History  Problem Relation Age of Onset  . Heart disease Father   . Depression Brother    History  Substance Use Topics  . Smoking status: Former Smoker    Quit date: 12/18/2009  . Smokeless tobacco: Not on file  . Alcohol Use: 0.0 oz/week    3-7 Cans of beer per week   OB History    No data available     Review of Systems  All systems reviewed and negative, other than as noted in HPI.   Allergies  Review of patient's allergies indicates no known allergies.  Home Medications   Prior to Admission medications   Medication Sig Start Date End Date Taking? Authorizing Provider  amphetamine-dextroamphetamine (ADDERALL) 20 MG tablet Take 20 mg by mouth 3 (three) times daily.   Yes Historical Provider, MD  buPROPion (WELLBUTRIN XL) 300 MG 24 hr tablet Take 300 mg by mouth 2 (two) times daily.   Yes Historical Provider, MD  clonazePAM (KLONOPIN) 1 MG tablet Take 1 mg by mouth 4 (four) times daily.   Yes Historical Provider, MD  cyclobenzaprine (FLEXERIL) 10 MG tablet Take 20 mg by mouth at bedtime. Muscle spasm.   Yes Historical Provider, MD  docusate sodium (COLACE) 100 MG capsule  Take 100 mg by mouth 2 (two) times daily.   Yes Historical Provider, MD  furosemide (LASIX) 20 MG tablet Take 20 mg by mouth at bedtime.   Yes Historical Provider, MD  gabapentin (NEURONTIN) 600 MG tablet Take 1 tablet (600 mg total) by mouth 3 (three) times daily. 09/16/14  Yes Charlett Blake, MD  meloxicam (MOBIC) 15 MG tablet Take 15 mg by mouth daily.   Yes Historical Provider, MD  omeprazole (PRILOSEC) 40 MG capsule Take 40 mg by mouth daily.   Yes Historical Provider, MD  oxyCODONE-acetaminophen (PERCOCET/ROXICET) 5-325 MG per tablet Take 1 tablet by mouth every 6 (six) hours as needed for severe pain. 08/19/14  Yes Harvie Heck, PA-C  topiramate (TOPAMAX) 200 MG tablet Take 600 mg by mouth at bedtime.   Yes Historical Provider, MD  traMADol (ULTRAM) 50 MG tablet Take 1 tablet (50 mg total) by mouth 3 (three) times daily. Maximum dose= 8 tablets per day. pain Patient taking differently: Take 50 mg by mouth 4 (four) times daily as needed for severe pain. Maximum dose= 8 tablets per day. pain 09/16/14  Yes Charlett Blake, MD  ziprasidone (GEODON) 60 MG capsule Take 60 mg by mouth at bedtime.   Yes Historical Provider, MD  furosemide (LASIX) 20 MG tablet Take 1 tablet (20 mg total) by mouth 2 (two) times daily. 03/26/15  Virgel Manifold, MD  traMADol (ULTRAM) 50 MG tablet Take 1 tablet (50 mg total) by mouth every 6 (six) hours as needed. 03/26/15   Virgel Manifold, MD   BP 113/60 mmHg  Pulse 75  Temp(Src) 98.2 F (36.8 C) (Oral)  Resp 14  SpO2 98% Physical Exam  Constitutional: She appears well-developed and well-nourished. No distress.  HENT:  Head: Normocephalic and atraumatic.  Eyes: Conjunctivae are normal. Right eye exhibits no discharge. Left eye exhibits no discharge.  Neck: Neck supple.  Cardiovascular: Normal rate, regular rhythm and normal heart sounds.  Exam reveals no gallop and no friction rub.   No murmur heard. Pulmonary/Chest: Effort normal and breath sounds normal.  No respiratory distress.  Abdominal: Soft. She exhibits no distension. There is no tenderness.  Musculoskeletal: She exhibits edema. She exhibits no tenderness.  Symmetric, nonpitting lower extremity edema. No calf tenderness. Negative Homans.  Neurological: She is alert.  Skin: Skin is warm and dry.  Psychiatric: She has a normal mood and affect. Her behavior is normal. Thought content normal.  Nursing note and vitals reviewed.   ED Course  Procedures (including critical care time) Labs Review Labs Reviewed  URINALYSIS, ROUTINE W REFLEX MICROSCOPIC (NOT AT [facility]) - Abnormal; Notable for the following:    APPearance CLOUDY (*)    Specific Gravity, Urine 1.004 (*)    All other components within normal limits  I-STAT CHEM 8, ED - Abnormal; Notable for the following:    Calcium, Ion 1.28 (*)    All other components within normal limits    Imaging Review No results found.   EKG Interpretation None      MDM   Final diagnoses:  Peripheral edema  Low back pain without sciatica, unspecified back pain laterality        Virgel Manifold, MD 04/04/15 2153

## 2015-04-16 DIAGNOSIS — M2550 Pain in unspecified joint: Secondary | ICD-10-CM | POA: Diagnosis not present

## 2015-04-16 DIAGNOSIS — M5412 Radiculopathy, cervical region: Secondary | ICD-10-CM | POA: Diagnosis not present

## 2015-04-16 DIAGNOSIS — M797 Fibromyalgia: Secondary | ICD-10-CM | POA: Diagnosis not present

## 2015-04-16 DIAGNOSIS — R609 Edema, unspecified: Secondary | ICD-10-CM | POA: Diagnosis not present

## 2015-04-16 DIAGNOSIS — Z6824 Body mass index (BMI) 24.0-24.9, adult: Secondary | ICD-10-CM | POA: Diagnosis not present

## 2015-05-18 DIAGNOSIS — I10 Essential (primary) hypertension: Secondary | ICD-10-CM | POA: Diagnosis not present

## 2015-05-18 DIAGNOSIS — Z86010 Personal history of colonic polyps: Secondary | ICD-10-CM | POA: Diagnosis not present

## 2015-05-18 DIAGNOSIS — Z8371 Family history of colonic polyps: Secondary | ICD-10-CM | POA: Diagnosis not present

## 2015-09-29 DIAGNOSIS — R531 Weakness: Secondary | ICD-10-CM | POA: Diagnosis not present

## 2015-09-29 DIAGNOSIS — G43909 Migraine, unspecified, not intractable, without status migrainosus: Secondary | ICD-10-CM | POA: Diagnosis not present

## 2015-09-29 DIAGNOSIS — R600 Localized edema: Secondary | ICD-10-CM | POA: Diagnosis not present

## 2015-09-29 DIAGNOSIS — G4489 Other headache syndrome: Secondary | ICD-10-CM | POA: Diagnosis not present

## 2015-09-29 DIAGNOSIS — M5442 Lumbago with sciatica, left side: Secondary | ICD-10-CM | POA: Diagnosis not present

## 2015-09-29 DIAGNOSIS — R609 Edema, unspecified: Secondary | ICD-10-CM | POA: Diagnosis not present

## 2015-09-29 DIAGNOSIS — M7989 Other specified soft tissue disorders: Secondary | ICD-10-CM | POA: Diagnosis not present

## 2016-01-11 DIAGNOSIS — Z9114 Patient's other noncompliance with medication regimen: Secondary | ICD-10-CM | POA: Diagnosis not present

## 2016-03-09 DIAGNOSIS — M797 Fibromyalgia: Secondary | ICD-10-CM | POA: Diagnosis not present

## 2016-03-09 DIAGNOSIS — G894 Chronic pain syndrome: Secondary | ICD-10-CM | POA: Diagnosis not present

## 2016-03-09 DIAGNOSIS — R600 Localized edema: Secondary | ICD-10-CM | POA: Diagnosis not present

## 2016-03-09 DIAGNOSIS — F3189 Other bipolar disorder: Secondary | ICD-10-CM | POA: Diagnosis not present

## 2016-03-27 DIAGNOSIS — Z90710 Acquired absence of both cervix and uterus: Secondary | ICD-10-CM | POA: Diagnosis not present

## 2016-03-27 DIAGNOSIS — F17290 Nicotine dependence, other tobacco product, uncomplicated: Secondary | ICD-10-CM | POA: Diagnosis not present

## 2016-03-27 DIAGNOSIS — F909 Attention-deficit hyperactivity disorder, unspecified type: Secondary | ICD-10-CM | POA: Diagnosis not present

## 2016-03-27 DIAGNOSIS — R1084 Generalized abdominal pain: Secondary | ICD-10-CM | POA: Diagnosis not present

## 2016-03-27 DIAGNOSIS — S3981XA Other specified injuries of abdomen, initial encounter: Secondary | ICD-10-CM | POA: Diagnosis not present

## 2016-03-27 DIAGNOSIS — F319 Bipolar disorder, unspecified: Secondary | ICD-10-CM | POA: Diagnosis not present

## 2016-04-22 DIAGNOSIS — M542 Cervicalgia: Secondary | ICD-10-CM | POA: Diagnosis not present

## 2016-04-22 DIAGNOSIS — F3189 Other bipolar disorder: Secondary | ICD-10-CM | POA: Diagnosis not present

## 2016-04-22 DIAGNOSIS — M797 Fibromyalgia: Secondary | ICD-10-CM | POA: Diagnosis not present

## 2016-04-22 DIAGNOSIS — G894 Chronic pain syndrome: Secondary | ICD-10-CM | POA: Diagnosis not present

## 2016-04-22 DIAGNOSIS — M5441 Lumbago with sciatica, right side: Secondary | ICD-10-CM | POA: Diagnosis not present

## 2016-04-22 DIAGNOSIS — R600 Localized edema: Secondary | ICD-10-CM | POA: Diagnosis not present

## 2016-07-19 DIAGNOSIS — R600 Localized edema: Secondary | ICD-10-CM | POA: Diagnosis not present

## 2016-07-28 DIAGNOSIS — R600 Localized edema: Secondary | ICD-10-CM | POA: Diagnosis not present

## 2016-07-28 DIAGNOSIS — M797 Fibromyalgia: Secondary | ICD-10-CM | POA: Diagnosis not present

## 2016-07-28 DIAGNOSIS — Z0001 Encounter for general adult medical examination with abnormal findings: Secondary | ICD-10-CM | POA: Diagnosis not present

## 2016-07-28 DIAGNOSIS — Z13220 Encounter for screening for lipoid disorders: Secondary | ICD-10-CM | POA: Diagnosis not present

## 2016-07-28 DIAGNOSIS — G894 Chronic pain syndrome: Secondary | ICD-10-CM | POA: Diagnosis not present

## 2016-07-28 DIAGNOSIS — M542 Cervicalgia: Secondary | ICD-10-CM | POA: Diagnosis not present

## 2016-07-28 DIAGNOSIS — M5441 Lumbago with sciatica, right side: Secondary | ICD-10-CM | POA: Diagnosis not present

## 2016-07-28 DIAGNOSIS — Z1329 Encounter for screening for other suspected endocrine disorder: Secondary | ICD-10-CM | POA: Diagnosis not present

## 2016-07-28 DIAGNOSIS — F3189 Other bipolar disorder: Secondary | ICD-10-CM | POA: Diagnosis not present

## 2016-08-04 DIAGNOSIS — R600 Localized edema: Secondary | ICD-10-CM | POA: Diagnosis not present

## 2016-08-04 DIAGNOSIS — F3189 Other bipolar disorder: Secondary | ICD-10-CM | POA: Diagnosis not present

## 2016-08-04 DIAGNOSIS — M542 Cervicalgia: Secondary | ICD-10-CM | POA: Diagnosis not present

## 2016-08-04 DIAGNOSIS — M797 Fibromyalgia: Secondary | ICD-10-CM | POA: Diagnosis not present

## 2016-08-04 DIAGNOSIS — G894 Chronic pain syndrome: Secondary | ICD-10-CM | POA: Diagnosis not present

## 2016-08-04 DIAGNOSIS — Z13228 Encounter for screening for other metabolic disorders: Secondary | ICD-10-CM | POA: Diagnosis not present

## 2016-08-11 DIAGNOSIS — Z1329 Encounter for screening for other suspected endocrine disorder: Secondary | ICD-10-CM | POA: Diagnosis not present

## 2016-08-11 DIAGNOSIS — G894 Chronic pain syndrome: Secondary | ICD-10-CM | POA: Diagnosis not present

## 2016-08-11 DIAGNOSIS — Z13220 Encounter for screening for lipoid disorders: Secondary | ICD-10-CM | POA: Diagnosis not present

## 2016-08-11 DIAGNOSIS — M542 Cervicalgia: Secondary | ICD-10-CM | POA: Diagnosis not present

## 2016-08-11 DIAGNOSIS — M5441 Lumbago with sciatica, right side: Secondary | ICD-10-CM | POA: Diagnosis not present

## 2016-08-11 DIAGNOSIS — D649 Anemia, unspecified: Secondary | ICD-10-CM | POA: Diagnosis not present

## 2016-08-11 DIAGNOSIS — M797 Fibromyalgia: Secondary | ICD-10-CM | POA: Diagnosis not present

## 2016-08-11 DIAGNOSIS — R600 Localized edema: Secondary | ICD-10-CM | POA: Diagnosis not present

## 2016-08-11 DIAGNOSIS — B351 Tinea unguium: Secondary | ICD-10-CM | POA: Diagnosis not present

## 2016-08-11 DIAGNOSIS — L603 Nail dystrophy: Secondary | ICD-10-CM | POA: Diagnosis not present

## 2016-08-11 DIAGNOSIS — F3189 Other bipolar disorder: Secondary | ICD-10-CM | POA: Diagnosis not present

## 2016-08-12 DIAGNOSIS — G894 Chronic pain syndrome: Secondary | ICD-10-CM | POA: Diagnosis not present

## 2016-08-15 ENCOUNTER — Telehealth (INDEPENDENT_AMBULATORY_CARE_PROVIDER_SITE_OTHER): Payer: Self-pay | Admitting: Orthopedic Surgery

## 2016-08-15 NOTE — Telephone Encounter (Signed)
Rita Bennett is calling to check on the status of a signed form they requested regarding a back/knee/shoulder brace that patient needs. Has this been sent in?

## 2016-08-18 NOTE — Telephone Encounter (Signed)
IC and advised we have not received anything.  She will refax to my attn.

## 2016-08-30 DIAGNOSIS — Z79899 Other long term (current) drug therapy: Secondary | ICD-10-CM | POA: Diagnosis not present

## 2016-08-30 DIAGNOSIS — R748 Abnormal levels of other serum enzymes: Secondary | ICD-10-CM | POA: Diagnosis not present

## 2016-08-30 DIAGNOSIS — R000 Tachycardia, unspecified: Secondary | ICD-10-CM | POA: Diagnosis not present

## 2016-08-30 DIAGNOSIS — R0682 Tachypnea, not elsewhere classified: Secondary | ICD-10-CM | POA: Diagnosis not present

## 2016-08-30 DIAGNOSIS — R7989 Other specified abnormal findings of blood chemistry: Secondary | ICD-10-CM | POA: Diagnosis not present

## 2016-08-30 DIAGNOSIS — R079 Chest pain, unspecified: Secondary | ICD-10-CM | POA: Diagnosis not present

## 2016-08-30 DIAGNOSIS — T405X1A Poisoning by cocaine, accidental (unintentional), initial encounter: Secondary | ICD-10-CM | POA: Diagnosis not present

## 2016-08-30 DIAGNOSIS — I501 Left ventricular failure: Secondary | ICD-10-CM | POA: Diagnosis not present

## 2016-08-30 DIAGNOSIS — R0602 Shortness of breath: Secondary | ICD-10-CM | POA: Diagnosis not present

## 2016-10-26 DIAGNOSIS — R079 Chest pain, unspecified: Secondary | ICD-10-CM | POA: Diagnosis not present

## 2016-10-26 DIAGNOSIS — R404 Transient alteration of awareness: Secondary | ICD-10-CM | POA: Diagnosis not present

## 2016-10-27 DIAGNOSIS — I341 Nonrheumatic mitral (valve) prolapse: Secondary | ICD-10-CM | POA: Diagnosis not present

## 2016-12-15 DIAGNOSIS — L0390 Cellulitis, unspecified: Secondary | ICD-10-CM | POA: Diagnosis not present

## 2017-01-22 DIAGNOSIS — M5441 Lumbago with sciatica, right side: Secondary | ICD-10-CM | POA: Diagnosis not present

## 2017-01-22 DIAGNOSIS — F3189 Other bipolar disorder: Secondary | ICD-10-CM | POA: Diagnosis not present

## 2017-01-31 DIAGNOSIS — G8929 Other chronic pain: Secondary | ICD-10-CM | POA: Diagnosis not present

## 2017-01-31 DIAGNOSIS — M545 Low back pain: Secondary | ICD-10-CM | POA: Diagnosis not present

## 2017-02-10 DIAGNOSIS — G894 Chronic pain syndrome: Secondary | ICD-10-CM | POA: Diagnosis not present

## 2017-02-10 DIAGNOSIS — R300 Dysuria: Secondary | ICD-10-CM | POA: Diagnosis not present

## 2017-02-10 DIAGNOSIS — N390 Urinary tract infection, site not specified: Secondary | ICD-10-CM | POA: Diagnosis not present

## 2017-02-10 DIAGNOSIS — Z09 Encounter for follow-up examination after completed treatment for conditions other than malignant neoplasm: Secondary | ICD-10-CM | POA: Diagnosis not present

## 2017-02-10 DIAGNOSIS — M797 Fibromyalgia: Secondary | ICD-10-CM | POA: Diagnosis not present

## 2017-02-10 DIAGNOSIS — K219 Gastro-esophageal reflux disease without esophagitis: Secondary | ICD-10-CM | POA: Diagnosis not present

## 2017-02-10 DIAGNOSIS — M62830 Muscle spasm of back: Secondary | ICD-10-CM | POA: Diagnosis not present

## 2017-02-10 DIAGNOSIS — M542 Cervicalgia: Secondary | ICD-10-CM | POA: Diagnosis not present

## 2017-02-10 DIAGNOSIS — M545 Low back pain: Secondary | ICD-10-CM | POA: Diagnosis not present

## 2017-02-10 DIAGNOSIS — F3189 Other bipolar disorder: Secondary | ICD-10-CM | POA: Diagnosis not present

## 2017-02-10 DIAGNOSIS — M5441 Lumbago with sciatica, right side: Secondary | ICD-10-CM | POA: Diagnosis not present

## 2017-02-10 DIAGNOSIS — D649 Anemia, unspecified: Secondary | ICD-10-CM | POA: Diagnosis not present

## 2017-02-13 DIAGNOSIS — M797 Fibromyalgia: Secondary | ICD-10-CM | POA: Diagnosis not present

## 2017-02-13 DIAGNOSIS — Z09 Encounter for follow-up examination after completed treatment for conditions other than malignant neoplasm: Secondary | ICD-10-CM | POA: Diagnosis not present

## 2017-02-13 DIAGNOSIS — M545 Low back pain: Secondary | ICD-10-CM | POA: Diagnosis not present

## 2017-02-13 DIAGNOSIS — D649 Anemia, unspecified: Secondary | ICD-10-CM | POA: Diagnosis not present

## 2017-02-13 DIAGNOSIS — M542 Cervicalgia: Secondary | ICD-10-CM | POA: Diagnosis not present

## 2017-02-13 DIAGNOSIS — M5441 Lumbago with sciatica, right side: Secondary | ICD-10-CM | POA: Diagnosis not present

## 2017-02-13 DIAGNOSIS — N390 Urinary tract infection, site not specified: Secondary | ICD-10-CM | POA: Diagnosis not present

## 2017-02-13 DIAGNOSIS — G894 Chronic pain syndrome: Secondary | ICD-10-CM | POA: Diagnosis not present

## 2017-02-21 DIAGNOSIS — M5441 Lumbago with sciatica, right side: Secondary | ICD-10-CM | POA: Diagnosis not present

## 2017-02-21 DIAGNOSIS — F3189 Other bipolar disorder: Secondary | ICD-10-CM | POA: Diagnosis not present

## 2017-03-08 DIAGNOSIS — Z01411 Encounter for gynecological examination (general) (routine) with abnormal findings: Secondary | ICD-10-CM | POA: Diagnosis not present

## 2017-03-08 DIAGNOSIS — Z1329 Encounter for screening for other suspected endocrine disorder: Secondary | ICD-10-CM | POA: Diagnosis not present

## 2017-03-08 DIAGNOSIS — B373 Candidiasis of vulva and vagina: Secondary | ICD-10-CM | POA: Diagnosis not present

## 2017-03-08 DIAGNOSIS — E079 Disorder of thyroid, unspecified: Secondary | ICD-10-CM | POA: Diagnosis not present

## 2017-03-08 DIAGNOSIS — Z1231 Encounter for screening mammogram for malignant neoplasm of breast: Secondary | ICD-10-CM | POA: Diagnosis not present

## 2017-03-08 DIAGNOSIS — Z205 Contact with and (suspected) exposure to viral hepatitis: Secondary | ICD-10-CM | POA: Diagnosis not present

## 2017-03-08 DIAGNOSIS — Z1211 Encounter for screening for malignant neoplasm of colon: Secondary | ICD-10-CM | POA: Diagnosis not present

## 2017-03-08 DIAGNOSIS — Z13220 Encounter for screening for lipoid disorders: Secondary | ICD-10-CM | POA: Diagnosis not present

## 2017-03-08 DIAGNOSIS — N952 Postmenopausal atrophic vaginitis: Secondary | ICD-10-CM | POA: Diagnosis not present

## 2017-03-16 DIAGNOSIS — A6004 Herpesviral vulvovaginitis: Secondary | ICD-10-CM | POA: Diagnosis not present

## 2017-03-16 DIAGNOSIS — L309 Dermatitis, unspecified: Secondary | ICD-10-CM | POA: Diagnosis not present

## 2021-05-31 ENCOUNTER — Other Ambulatory Visit: Payer: Self-pay | Admitting: Obstetrics and Gynecology

## 2021-05-31 DIAGNOSIS — R928 Other abnormal and inconclusive findings on diagnostic imaging of breast: Secondary | ICD-10-CM
